# Patient Record
Sex: Female | Born: 1945
Health system: Southern US, Community
[De-identification: ages and names within clinical notes are randomized; demographics above are authoritative.]

## PROBLEM LIST (undated history)

## (undated) DIAGNOSIS — M810 Age-related osteoporosis without current pathological fracture: Secondary | ICD-10-CM

## (undated) DIAGNOSIS — I480 Paroxysmal atrial fibrillation: Secondary | ICD-10-CM

## (undated) DIAGNOSIS — R109 Unspecified abdominal pain: Secondary | ICD-10-CM

## (undated) DIAGNOSIS — E785 Hyperlipidemia, unspecified: Secondary | ICD-10-CM

## (undated) DIAGNOSIS — K219 Gastro-esophageal reflux disease without esophagitis: Secondary | ICD-10-CM

## (undated) DIAGNOSIS — N189 Chronic kidney disease, unspecified: Secondary | ICD-10-CM

## (undated) DIAGNOSIS — Z8719 Personal history of other diseases of the digestive system: Secondary | ICD-10-CM

## (undated) DIAGNOSIS — T7840XA Allergy, unspecified, initial encounter: Secondary | ICD-10-CM

## (undated) DIAGNOSIS — I10 Essential (primary) hypertension: Secondary | ICD-10-CM

## (undated) DIAGNOSIS — N029 Recurrent and persistent hematuria with unspecified morphologic changes: Secondary | ICD-10-CM

## (undated) DIAGNOSIS — I728 Aneurysm of other specified arteries: Secondary | ICD-10-CM

## (undated) HISTORY — DX: Hyperlipidemia, unspecified: E78.5

## (undated) HISTORY — DX: Paroxysmal atrial fibrillation: I48.0

## (undated) HISTORY — PX: ABDOMINAL HYSTERECTOMY: SHX81

## (undated) HISTORY — PX: BILATERAL OOPHORECTOMY: SHX1221

## (undated) HISTORY — DX: Allergy, unspecified, initial encounter: T78.40XA

## (undated) HISTORY — DX: Personal history of other diseases of the digestive system: Z87.19

## (undated) HISTORY — PX: CATARACT EXTRACTION, BILATERAL: SHX1313

## (undated) HISTORY — DX: Chronic kidney disease, unspecified: N18.9

## (undated) HISTORY — DX: Age-related osteoporosis without current pathological fracture: M81.0

## (undated) HISTORY — PX: TUMOR EXCISION: SHX421

## (undated) HISTORY — PX: COLONOSCOPY: SHX174

## (undated) HISTORY — DX: Recurrent and persistent hematuria with unspecified morphologic changes: N02.9

## (undated) HISTORY — DX: Unspecified abdominal pain: R10.9

## (undated) HISTORY — DX: Gastro-esophageal reflux disease without esophagitis: K21.9

## (undated) HISTORY — DX: Aneurysm of other specified arteries: I72.8

## (undated) HISTORY — PX: BREAST BIOPSY: SHX20

## (undated) HISTORY — DX: Essential (primary) hypertension: I10

---

## 2001-04-15 ENCOUNTER — Encounter: Payer: Self-pay | Admitting: Family Medicine

## 2001-04-15 ENCOUNTER — Ambulatory Visit (HOSPITAL_COMMUNITY): Admission: RE | Admit: 2001-04-15 | Discharge: 2001-04-15 | Payer: Self-pay | Admitting: Family Medicine

## 2001-07-22 ENCOUNTER — Other Ambulatory Visit: Admission: RE | Admit: 2001-07-22 | Discharge: 2001-07-22 | Payer: Self-pay | Admitting: Family Medicine

## 2001-11-03 ENCOUNTER — Other Ambulatory Visit: Admission: RE | Admit: 2001-11-03 | Discharge: 2001-11-03 | Payer: Self-pay | Admitting: Obstetrics & Gynecology

## 2004-10-06 ENCOUNTER — Other Ambulatory Visit: Admission: RE | Admit: 2004-10-06 | Discharge: 2004-10-06 | Payer: Self-pay | Admitting: Family Medicine

## 2005-01-26 ENCOUNTER — Encounter (INDEPENDENT_AMBULATORY_CARE_PROVIDER_SITE_OTHER): Payer: Self-pay | Admitting: *Deleted

## 2005-01-26 ENCOUNTER — Ambulatory Visit (HOSPITAL_COMMUNITY): Admission: RE | Admit: 2005-01-26 | Discharge: 2005-01-26 | Payer: Self-pay | Admitting: Gastroenterology

## 2005-06-03 ENCOUNTER — Emergency Department (HOSPITAL_COMMUNITY): Admission: EM | Admit: 2005-06-03 | Discharge: 2005-06-03 | Payer: Self-pay | Admitting: Emergency Medicine

## 2005-07-17 HISTORY — PX: KNEE SURGERY: SHX244

## 2007-07-10 ENCOUNTER — Other Ambulatory Visit: Admission: RE | Admit: 2007-07-10 | Discharge: 2007-07-10 | Payer: Self-pay | Admitting: Family Medicine

## 2008-12-22 ENCOUNTER — Ambulatory Visit (HOSPITAL_COMMUNITY): Admission: RE | Admit: 2008-12-22 | Discharge: 2008-12-22 | Payer: Self-pay | Admitting: Family Medicine

## 2010-12-17 HISTORY — PX: FOOT SURGERY: SHX648

## 2011-10-25 ENCOUNTER — Other Ambulatory Visit: Payer: Self-pay | Admitting: Family Medicine

## 2011-10-25 DIAGNOSIS — M542 Cervicalgia: Secondary | ICD-10-CM

## 2011-10-26 ENCOUNTER — Ambulatory Visit
Admission: RE | Admit: 2011-10-26 | Discharge: 2011-10-26 | Disposition: A | Discharge status: Home / Self Care | Payer: PRIVATE HEALTH INSURANCE | Source: Ambulatory Visit | Attending: Family Medicine | Admitting: Family Medicine

## 2011-10-26 DIAGNOSIS — M542 Cervicalgia: Secondary | ICD-10-CM

## 2011-12-25 ENCOUNTER — Other Ambulatory Visit: Payer: Self-pay | Admitting: Orthopedic Surgery

## 2011-12-25 DIAGNOSIS — M25512 Pain in left shoulder: Secondary | ICD-10-CM

## 2011-12-31 ENCOUNTER — Inpatient Hospital Stay: Admission: RE | Admit: 2011-12-31 | Payer: PRIVATE HEALTH INSURANCE | Source: Ambulatory Visit

## 2012-01-05 ENCOUNTER — Ambulatory Visit
Admission: RE | Admit: 2012-01-05 | Discharge: 2012-01-05 | Disposition: A | Discharge status: Home / Self Care | Payer: Medicare Other | Source: Ambulatory Visit | Attending: Orthopedic Surgery | Admitting: Orthopedic Surgery

## 2012-01-05 DIAGNOSIS — M25512 Pain in left shoulder: Secondary | ICD-10-CM

## 2013-09-08 ENCOUNTER — Other Ambulatory Visit: Payer: Self-pay | Admitting: Gastroenterology

## 2013-09-08 DIAGNOSIS — R109 Unspecified abdominal pain: Secondary | ICD-10-CM

## 2013-09-10 ENCOUNTER — Ambulatory Visit
Admission: RE | Admit: 2013-09-10 | Discharge: 2013-09-10 | Disposition: A | Discharge status: Home / Self Care | Payer: Medicare Other | Source: Ambulatory Visit | Attending: Gastroenterology | Admitting: Gastroenterology

## 2013-09-10 DIAGNOSIS — R109 Unspecified abdominal pain: Secondary | ICD-10-CM

## 2013-09-10 MED ORDER — IOHEXOL 300 MG/ML  SOLN
100.0000 mL | Freq: Once | INTRAMUSCULAR | Status: AC | PRN
  Administered 2013-09-10: 100 mL via INTRAVENOUS

## 2013-09-18 ENCOUNTER — Other Ambulatory Visit (HOSPITAL_COMMUNITY): Payer: Self-pay | Admitting: Family Medicine

## 2013-09-18 DIAGNOSIS — R9389 Abnormal findings on diagnostic imaging of other specified body structures: Secondary | ICD-10-CM

## 2013-09-18 DIAGNOSIS — M899 Disorder of bone, unspecified: Secondary | ICD-10-CM

## 2013-09-25 ENCOUNTER — Encounter (HOSPITAL_COMMUNITY): Payer: Medicare Other

## 2013-10-02 ENCOUNTER — Encounter (HOSPITAL_COMMUNITY)
Admission: RE | Admit: 2013-10-02 | Discharge: 2013-10-02 | Disposition: A | Discharge status: Home / Self Care | Payer: Medicare Other | Source: Ambulatory Visit | Attending: Family Medicine | Admitting: Family Medicine

## 2013-10-02 DIAGNOSIS — M899 Disorder of bone, unspecified: Secondary | ICD-10-CM | POA: Insufficient documentation

## 2013-10-02 DIAGNOSIS — R9389 Abnormal findings on diagnostic imaging of other specified body structures: Secondary | ICD-10-CM

## 2013-10-02 MED ORDER — TECHNETIUM TC 99M MEDRONATE IV KIT
26.1000 | PACK | Freq: Once | INTRAVENOUS | Status: AC | PRN
  Administered 2013-10-02: 26.1 via INTRAVENOUS

## 2013-10-17 DIAGNOSIS — I728 Aneurysm of other specified arteries: Secondary | ICD-10-CM

## 2013-10-17 DIAGNOSIS — R109 Unspecified abdominal pain: Secondary | ICD-10-CM

## 2013-10-17 HISTORY — DX: Aneurysm of other specified arteries: I72.8

## 2013-10-17 HISTORY — DX: Unspecified abdominal pain: R10.9

## 2013-10-29 ENCOUNTER — Other Ambulatory Visit: Payer: Self-pay | Admitting: Gastroenterology

## 2013-10-29 DIAGNOSIS — R109 Unspecified abdominal pain: Secondary | ICD-10-CM

## 2013-11-02 ENCOUNTER — Ambulatory Visit
Admission: RE | Admit: 2013-11-02 | Discharge: 2013-11-02 | Disposition: A | Discharge status: Home / Self Care | Payer: Medicare Other | Source: Ambulatory Visit | Attending: Gastroenterology | Admitting: Gastroenterology

## 2013-11-02 DIAGNOSIS — R109 Unspecified abdominal pain: Secondary | ICD-10-CM

## 2013-11-03 ENCOUNTER — Encounter: Payer: Self-pay | Admitting: Vascular Surgery

## 2013-11-30 ENCOUNTER — Encounter: Payer: Self-pay | Admitting: Vascular Surgery

## 2013-12-01 ENCOUNTER — Ambulatory Visit (INDEPENDENT_AMBULATORY_CARE_PROVIDER_SITE_OTHER): Payer: Medicare Other | Admitting: Vascular Surgery

## 2013-12-01 ENCOUNTER — Encounter (INDEPENDENT_AMBULATORY_CARE_PROVIDER_SITE_OTHER): Payer: Self-pay

## 2013-12-01 ENCOUNTER — Encounter: Payer: Self-pay | Admitting: Vascular Surgery

## 2013-12-01 VITALS — Ht 64.25 in | Wt 155.0 lb

## 2013-12-01 DIAGNOSIS — I728 Aneurysm of other specified arteries: Secondary | ICD-10-CM

## 2013-12-01 NOTE — Progress Notes (Signed)
Patient name: Mary Riley MRN: [id number] DOB: July 31, 1946 Sex: female   Referred by: Dulce Sellar  Reason for referral:  Chief Complaint  Patient presents with  . New Evaluation    small splenic artery aneurysm     HISTORY OF PRESENT ILLNESS: The patient presents today for discussion of incidental finding of splenic artery aneurysm. She underwent CT scan for evaluation of diffuse abdominal discomfort. No etiology was found. She was had an incidental finding of a 1.3 cm splenic artery aneurysm is here today for discussion of this. She is quite concerned regarding this. Does have a family history of intracranial aneurysm with complication. He has no other history of the aneurysm. Does have a history of Crohn's disease and gastroesophageal reflux. Her abdominal cyst of symptoms her lower abdominal and are associated with bloating as well.  Past Medical History  Diagnosis Date  . Aneurysm of splenic artery Nov 2014    Ref by Dr. Willis Modena  . Allergy   . Hypertension   . GERD (gastroesophageal reflux disease)   . History of Crohn's disease   . Benign hematuria   . Abdominal pain Nov. 2014    Past Surgical History  Procedure Laterality Date  . Knee surgery Right Aug. 2006  . Foot surgery Left 2012  . Colonoscopy  2006 and 2011    Dr. Sherin Quarry    History   Social History  . Marital Status: Married    Spouse Name: N/A    Number of Children: N/A  . Years of Education: N/A   Occupational History  . Not on file.   Social History Main Topics  . Smoking status: Never Smoker   . Smokeless tobacco: Never Used  . Alcohol Use: 0.6 oz/week    1 Glasses of wine per week  . Drug Use: No  . Sexual Activity: Not on file   Other Topics Concern  . Not on file   Social History Narrative  . No narrative on file    Family History  Problem Relation Age of Onset  . Heart disease Mother 56    Heart Disease before age 8- Brain Aneurysm  . Hypertension Mother   . Aneurysm  Mother     brain  . Heart disease Sister     Brain Aneurysm  . Aneurysm Sister     brain   . Cancer Maternal Grandmother     Colon  . Cancer Brother     Allergies as of 12/01/2013 - Review Complete 12/01/2013  Allergen Reaction Noted  . Dilaudid [hydromorphone hcl] Nausea And Vomiting 10/02/2013  . Ace inhibitors Cough 10/02/2013  . Codeine Nausea Only 10/02/2013  . Crestor [rosuvastatin] Other (See Comments) 10/02/2013  . Demerol [meperidine] Nausea Only 10/02/2013  . Lipitor [atorvastatin] Other (See Comments) 10/02/2013  . Zithromax [azithromycin]  10/02/2013  . Biaxin [clarithromycin] Rash 10/02/2013  . Sulfa antibiotics Rash 10/02/2013    Current Outpatient Prescriptions on File Prior to Visit  Medication Sig Dispense Refill  . Cetirizine-Pseudoephedrine (ZYRTEC-D PO) Take 5-120 mg by mouth as needed.       [name] omeprazole (PRILOSEC) 40 MG capsule Take 40 mg by mouth daily.       No current facility-administered medications on file prior to visit.     REVIEW OF SYSTEMS:  Positives indicated with an "X"  CARDIOVASCULAR:  [ ]  chest pain   [ ]  chest pressure   [ ]  palpitations   [ ]  orthopnea   [ ]  dyspnea  on exertion   [ ]  claudication   [ ]  rest pain   [ ]  DVT   [ ]  phlebitis PULMONARY:   [ ]  productive cough   [ ]  asthma   [ ]  wheezing NEUROLOGIC:   [ ]  weakness  [ ]  paresthesias  [ ]  aphasia  [ ]  amaurosis  [ ]  dizziness HEMATOLOGIC:   [ ]  bleeding problems   [ ]  clotting disorders MUSCULOSKELETAL:  [ ]  joint pain   [ ]  joint swelling GASTROINTESTINAL: [ ]   blood in stool  [ ]   hematemesis GENITOURINARY:  [ ]   dysuria  [ ]   hematuria PSYCHIATRIC:  [ ]  history of major depression INTEGUMENTARY:  [ ]  rashes  [ ]  ulcers CONSTITUTIONAL:  [ ]  fever   [ ]  chills  PHYSICAL EXAMINATION:  General: The patient is a well-nourished female, in no acute distress. Vital signs are BP   Ht 5' 4.25" (1.632 m)  Wt 155 lb (70.308 kg)  BMI 26.40 kg/m2 Pulmonary: There is a good  air exchange bilaterally without wheezing or rales. Abdomen: Soft and non-tender with normal pitch bowel sounds. No masses noted no abdominal bruit Musculoskeletal: There are no major deformities.  There is no significant extremity pain. Neurologic: No focal weakness or paresthesias are detected, Skin: There are no ulcer or rashes noted. Psychiatric: The patient has normal affect. Cardiovascular: There is a regular rate and rhythm without significant murmur appreciated. 2+ radial and femoral pulses  CT scan was reviewed. This reveals a partially calcified 1.3 cm splenic artery aneurysm which is in the midportion of the spleen.  Impression and Plan:  I had a long discussion with the patient. I explained the natural history of splenic artery aneurysms. I explained that all like that this is been present for many years and was an incidental finding with her recent CT scan. I explained the natural history which I usually is very stable size. I did explain that this could potentially grow and could potentially be at risk for a period I explained symptoms of leaking splenic artery aneurysm to include upper abdominal and flank pain. Explain that the likelihood with a 1.3 cm is extremely low. I did explain the need for continued observation of this. I have recommended that we repeat her CT scan in one year to rule out any progression in size. It did remain stable we would potentially drop back to every other year CT scan. She understands to report immediately to the emergency room should she have any new onset of severe pain. Otherwise we will see her in one year for continued followup    Cletus Mehlhoff [facility] Office: [phone]

## 2013-12-01 NOTE — Progress Notes (Signed)
Vitals were not taken at this visit due to patient leaving before nurse exam was complete. Chanda Maness-Harrison CMA, AAMA

## 2013-12-02 ENCOUNTER — Encounter: Payer: Self-pay | Admitting: *Deleted

## 2013-12-02 NOTE — Addendum Note (Signed)
Addended by: Sharee Pimple on: 12/02/2013 10:33 AM   Modules accepted: Orders

## 2014-12-07 ENCOUNTER — Ambulatory Visit: Payer: Medicare Other | Admitting: Vascular Surgery

## 2014-12-07 ENCOUNTER — Other Ambulatory Visit: Payer: Medicare Other

## 2014-12-27 ENCOUNTER — Encounter: Payer: Self-pay | Admitting: Vascular Surgery

## 2014-12-27 ENCOUNTER — Other Ambulatory Visit: Payer: Self-pay | Admitting: *Deleted

## 2014-12-27 DIAGNOSIS — Z01812 Encounter for preprocedural laboratory examination: Secondary | ICD-10-CM

## 2014-12-28 ENCOUNTER — Inpatient Hospital Stay: Admission: RE | Admit: 2014-12-28 | Payer: Medicare Other | Source: Ambulatory Visit

## 2014-12-28 ENCOUNTER — Ambulatory Visit: Payer: Medicare Other | Admitting: Vascular Surgery

## 2016-01-18 ENCOUNTER — Other Ambulatory Visit: Payer: Self-pay | Admitting: Family Medicine

## 2016-01-18 DIAGNOSIS — I728 Aneurysm of other specified arteries: Secondary | ICD-10-CM

## 2016-01-25 ENCOUNTER — Ambulatory Visit
Admission: RE | Admit: 2016-01-25 | Discharge: 2016-01-25 | Disposition: A | Discharge status: Home / Self Care | Payer: Medicare Other | Source: Ambulatory Visit | Attending: Family Medicine | Admitting: Family Medicine

## 2016-01-25 DIAGNOSIS — I728 Aneurysm of other specified arteries: Secondary | ICD-10-CM

## 2016-01-25 MED ORDER — IOPAMIDOL (ISOVUE-370) INJECTION 76%
75.0000 mL | Freq: Once | INTRAVENOUS | Status: AC | PRN
  Administered 2016-01-25: 75 mL via INTRAVENOUS

## 2016-02-02 ENCOUNTER — Encounter (HOSPITAL_COMMUNITY): Payer: Self-pay | Admitting: Emergency Medicine

## 2016-02-02 ENCOUNTER — Emergency Department (HOSPITAL_COMMUNITY): Payer: Medicare Other

## 2016-02-02 ENCOUNTER — Observation Stay (HOSPITAL_COMMUNITY)
Admission: EM | Admit: 2016-02-02 | Discharge: 2016-02-03 | Disposition: A | Discharge status: Home / Self Care | Payer: Medicare Other | Attending: Cardiovascular Disease | Admitting: Cardiovascular Disease

## 2016-02-02 DIAGNOSIS — N029 Recurrent and persistent hematuria with unspecified morphologic changes: Secondary | ICD-10-CM | POA: Diagnosis not present

## 2016-02-02 DIAGNOSIS — Z79899 Other long term (current) drug therapy: Secondary | ICD-10-CM | POA: Insufficient documentation

## 2016-02-02 DIAGNOSIS — I4891 Unspecified atrial fibrillation: Secondary | ICD-10-CM | POA: Diagnosis present

## 2016-02-02 DIAGNOSIS — K219 Gastro-esophageal reflux disease without esophagitis: Secondary | ICD-10-CM | POA: Diagnosis not present

## 2016-02-02 DIAGNOSIS — I10 Essential (primary) hypertension: Secondary | ICD-10-CM | POA: Insufficient documentation

## 2016-02-02 DIAGNOSIS — I728 Aneurysm of other specified arteries: Secondary | ICD-10-CM | POA: Insufficient documentation

## 2016-02-02 LAB — CBC
HCT: 42.8 % (ref 36.0–46.0)
HEMOGLOBIN: 14 g/dL (ref 12.0–15.0)
MCH: 30.7 pg (ref 26.0–34.0)
MCHC: 32.7 g/dL (ref 30.0–36.0)
MCV: 93.9 fL (ref 78.0–100.0)
Platelets: 286 10*3/uL (ref 150–400)
RBC: 4.56 MIL/uL (ref 3.87–5.11)
RDW: 12.7 % (ref 11.5–15.5)
WBC: 9.3 10*3/uL (ref 4.0–10.5)

## 2016-02-02 LAB — BASIC METABOLIC PANEL
ANION GAP: 14 (ref 5–15)
BUN: 18 mg/dL (ref 6–20)
CALCIUM: 9.7 mg/dL (ref 8.9–10.3)
CO2: 21 mmol/L — AB (ref 22–32)
Chloride: 107 mmol/L (ref 101–111)
Creatinine, Ser: 1.1 mg/dL — ABNORMAL HIGH (ref 0.44–1.00)
GFR calc Af Amer: 58 mL/min — ABNORMAL LOW (ref >60)
GFR calc non Af Amer: 50 mL/min — ABNORMAL LOW (ref >60)
GLUCOSE: 98 mg/dL (ref 65–99)
Potassium: 3.6 mmol/L (ref 3.5–5.1)
Sodium: 142 mmol/L (ref 135–145)

## 2016-02-02 LAB — T4, FREE: Free T4: 0.86 ng/dL (ref 0.61–1.12)

## 2016-02-02 LAB — MAGNESIUM: Magnesium: 1.9 mg/dL (ref 1.7–2.4)

## 2016-02-02 LAB — TSH: TSH: 4.323 u[IU]/mL (ref 0.350–4.500)

## 2016-02-02 MED ORDER — ACETAMINOPHEN 325 MG PO TABS: 650.0000 mg | ORAL_TABLET | ORAL | Status: DC | PRN

## 2016-02-02 MED ORDER — SODIUM CHLORIDE 0.9 % IV BOLUS (SEPSIS)
500.0000 mL | Freq: Once | INTRAVENOUS | Status: AC
  Administered 2016-02-02: 500 mL via INTRAVENOUS

## 2016-02-02 MED ORDER — PRAVASTATIN SODIUM 20 MG PO TABS
20.0000 mg | ORAL_TABLET | Freq: Every day | ORAL | Status: DC
Start: 2016-02-03 — End: 2016-02-03

## 2016-02-02 MED ORDER — METOPROLOL TARTRATE 25 MG PO TABS
50.0000 mg | ORAL_TABLET | Freq: Once | ORAL | Status: AC
  Administered 2016-02-02: 50 mg via ORAL
  Filled 2016-02-02: qty 2

## 2016-02-02 MED ORDER — ONDANSETRON HCL 4 MG/2ML IJ SOLN: 4.0000 mg | Freq: Four times a day (QID) | INTRAMUSCULAR | Status: DC | PRN

## 2016-02-02 MED ORDER — METOPROLOL TARTRATE 50 MG PO TABS
50.0000 mg | ORAL_TABLET | Freq: Two times a day (BID) | ORAL | Status: DC
  Administered 2016-02-02 – 2016-02-03 (×2): 50 mg via ORAL
  Filled 2016-02-02 (×2): qty 1

## 2016-02-02 MED ORDER — AMLODIPINE BESYLATE 5 MG PO TABS
5.0000 mg | ORAL_TABLET | Freq: Every day | ORAL | Status: DC
  Administered 2016-02-02: 5 mg via ORAL
  Filled 2016-02-02: qty 1

## 2016-02-02 MED ORDER — PANTOPRAZOLE SODIUM 40 MG PO TBEC
40.0000 mg | DELAYED_RELEASE_TABLET | Freq: Every day | ORAL | Status: DC
  Administered 2016-02-03: 40 mg via ORAL
  Filled 2016-02-02: qty 1

## 2016-02-02 MED ORDER — NITROGLYCERIN 0.4 MG SL SUBL: 0.4000 mg | SUBLINGUAL_TABLET | SUBLINGUAL | Status: DC | PRN

## 2016-02-02 MED ORDER — VITAMIN D 1000 UNITS PO TABS
1000.0000 [IU] | ORAL_TABLET | Freq: Every day | ORAL | Status: DC
  Administered 2016-02-03: 1000 [IU] via ORAL
  Filled 2016-02-02: qty 1

## 2016-02-02 MED ORDER — HEPARIN SODIUM (PORCINE) 5000 UNIT/ML IJ SOLN
5000.0000 [IU] | Freq: Three times a day (TID) | INTRAMUSCULAR | Status: DC
  Administered 2016-02-02 – 2016-02-03 (×2): 5000 [IU] via SUBCUTANEOUS
  Filled 2016-02-02 (×2): qty 1

## 2016-02-02 MED ORDER — ASPIRIN EC 81 MG PO TBEC
81.0000 mg | DELAYED_RELEASE_TABLET | Freq: Every day | ORAL | Status: DC
  Administered 2016-02-02 – 2016-02-03 (×2): 81 mg via ORAL
  Filled 2016-02-02 (×2): qty 1

## 2016-02-02 NOTE — ED Notes (Signed)
Unable to give report at this time.  Nurse is to call back

## 2016-02-02 NOTE — ED Notes (Signed)
Pt arrives via [facility], ems reports patient was painting when she began feeling like she had indigestion and like her heart was racing, pt was found to be in afib rvr upon ems arrival with rate of 180-200, pt given 6mg  of adenosine given then a second dose of 12mg  of adenosine given. Pt given a total of 20mg  cardizem, pt at a rate of 116, Sinus rhythm upon arrival. Pt denies any cp or sob, a/o x4

## 2016-02-02 NOTE — ED Notes (Signed)
MD at bedside. 

## 2016-02-02 NOTE — ED Provider Notes (Signed)
CSN: [id number]     Arrival date & time 02/02/16  1425 History   First MD Initiated Contact with Patient 02/02/16 1501     Chief Complaint  Patient presents with  . Atrial Fibrillation     (Consider location/radiation/quality/duration/timing/severity/associated sxs/prior Treatment) Patient is a 70 y.o. female presenting with atrial fibrillation. The history is provided by the patient.  Atrial Fibrillation This is a recurrent problem. The current episode started 1 to 4 weeks ago. The problem occurs intermittently. The problem has been resolved. Pertinent negatives include no abdominal pain, anorexia, chest pain, chills, congestion, coughing, fatigue, fever, headaches, nausea, rash, sore throat, vertigo, visual change, vomiting or weakness. Nothing aggravates the symptoms. Treatments tried: diltiazem given by EMS (20mg  total)    Past Medical History  Diagnosis Date  . Aneurysm of splenic artery [facility]) Nov 2014    Ref by Dr. Arta Silence  . Allergy   . Hypertension   . GERD (gastroesophageal reflux disease)   . History of Crohn's disease   . Benign hematuria   . Abdominal pain Nov. 2014   Past Surgical History  Procedure Laterality Date  . Knee surgery Right Aug. 2006  . Foot surgery Left 2012  . Colonoscopy  2006 and 2011    Dr. Sammuel Cooper   Family History  Problem Relation Age of Onset  . Heart disease Mother 15    Heart Disease before age 80- Brain Aneurysm  . Hypertension Mother   . Aneurysm Mother     brain  . Heart disease Sister     Brain Aneurysm  . Aneurysm Sister     brain   . Cancer Maternal Grandmother     Colon  . Cancer Brother    Social History  Substance Use Topics  . Smoking status: Never Smoker   . Smokeless tobacco: Never Used  . Alcohol Use: 0.6 oz/week    1 Glasses of wine per week   OB History    No data available     Review of Systems  Constitutional: Negative for fever, chills, appetite change and fatigue.  HENT: Negative for  congestion, ear pain, facial swelling, mouth sores and sore throat.   Eyes: Negative for visual disturbance.  Respiratory: Negative for cough, chest tightness and shortness of breath.   Cardiovascular: Negative for chest pain and palpitations.  Gastrointestinal: Negative for nausea, vomiting, abdominal pain, diarrhea, blood in stool and anorexia.  Endocrine: Negative for cold intolerance and heat intolerance.  Genitourinary: Negative for frequency, decreased urine volume and difficulty urinating.  Musculoskeletal: Negative for back pain and neck stiffness.  Skin: Negative for rash.  Neurological: Negative for dizziness, vertigo, weakness, light-headedness and headaches.  All other systems reviewed and are negative.     Allergies  Dilaudid; Ace inhibitors; Codeine; Crestor; Demerol; Lipitor; Zithromax; Biaxin; and Sulfa antibiotics  Home Medications   Prior to Admission medications   Medication Sig Start Date End Date Taking? Authorizing Provider  amLODipine (NORVASC) 5 MG tablet Take 5 mg by mouth at bedtime. 01/26/16  Yes Historical Provider, MD  cholecalciferol (VITAMIN D) 1000 units tablet Take 1,000 Units by mouth daily.   Yes Historical Provider, MD  Coenzyme Q10 (COQ10 PO) Take 1 tablet by mouth daily.   Yes Historical Provider, MD  LIVALO 1 MG TABS Take 1 mg by mouth 2 (two) times a week. 01/24/16  Yes Historical Provider, MD  omeprazole (PRILOSEC) 40 MG capsule Take 40 mg by mouth daily.   Yes Historical Provider, MD  BP 161/71 mmHg  Pulse 82  Temp(Src) 98 F (36.7 C) (Oral)  Resp 19  Ht 5\' 3"  (1.6 m)  Wt 69.355 kg  BMI 27.09 kg/m2  SpO2 100% Physical Exam  Constitutional: She is oriented to person, place, and time. She appears well-developed and well-nourished. No distress.  HENT:  Head: Normocephalic and atraumatic.  Right Ear: External ear normal.  Left Ear: External ear normal.  Nose: Nose normal.  Eyes: Conjunctivae and EOM are normal. Pupils are equal, round,  and reactive to light. Right eye exhibits no discharge. Left eye exhibits no discharge. No scleral icterus.  Neck: Normal range of motion. Neck supple.  Cardiovascular: Regular rhythm and normal heart sounds.  Tachycardia present.  Exam reveals no gallop and no friction rub.   No murmur heard. Pulmonary/Chest: Effort normal and breath sounds normal. No stridor. No respiratory distress. She has no wheezes.  Abdominal: Soft. She exhibits no distension. There is no tenderness.  Musculoskeletal: She exhibits no edema or tenderness.  Neurological: She is alert and oriented to person, place, and time.  Skin: Skin is warm and dry. No rash noted. She is not diaphoretic. No erythema.  Psychiatric: She has a normal mood and affect.    ED Course  Procedures (including critical care time) Labs Review Labs Reviewed  BASIC METABOLIC PANEL - Abnormal; Notable for the following:    CO2 21 (*)    Creatinine, Ser 1.10 (*)    GFR calc non Af Amer 50 (*)    GFR calc Af Amer 58 (*)    All other components within normal limits  CBC  TSH  T4, FREE  MAGNESIUM  BASIC METABOLIC PANEL  LIPID PANEL    Imaging Review Dg Chest 2 View  02/02/2016  CLINICAL DATA:  Tachycardia and hypertension EXAM: CHEST  2 VIEW COMPARISON:  None. FINDINGS: The heart size and mediastinal contours are within normal limits. Both lungs are clear. The visualized skeletal structures are unremarkable. IMPRESSION: No active cardiopulmonary disease. Electronically Signed   By: Inez Catalina M.D.   On: 02/02/2016 15:10   I have personally reviewed and evaluated these images and lab results as part of my medical decision-making.   EKG Interpretation   Date/Time:  Thursday February 02 2016 17:01:30 EST Ventricular Rate:  94 PR Interval:  144 QRS Duration: 92 QT Interval:  362 QTC Calculation: 453 R Axis:   65 Text Interpretation:  Sinus rhythm Abnormal R-wave progression, early  transition Borderline T abnormalities, anterior  leads Improved tachycardia  compared to last EKG Confirmed by LIU MD, DANA [phone]) on 02/02/2016  5:06:51 PM      MDM   70 year old female is evidence with new onset A. fib RVR noted by EMS with a rate of 180s to 200s. Caryl Pina thought to be SVT but was not responsive to adenosine. A. fib was converted with 20 mg of diltiazem. Rest of the history as above.  On arrival patient was tachycardic and sinus rhythm. Patient was provided with IV fluids with improvement of rate to below 100s. She maintained normal sinus rhythm en route. EKGs. Labs did not reveal any evidence to suggest an trigger. Her history is not suggestive of pulmonary embolism.  Patient has a CHAD-VASC of 3. Cardiology consultation who assessed the patient in the emergency department and will admit for further management.  Patient was seen in conjunction with Dr. Oleta Mouse.  Final diagnoses:  Atrial fibrillation with RVR ([place])        Addison Lank,  MD 02/03/16 0130  Forde Dandy, MD 02/03/16 [id number]

## 2016-02-02 NOTE — ED Notes (Signed)
Patient transported to X-ray 

## 2016-02-02 NOTE — H&P (Signed)
Patient ID: Joddie Digiorgio MRN: [id number], DOB/AGE: 06-03-1946   Admit date: 02/02/2016   Primary Physician: Vidal Schwalbe, MD Primary Cardiologist: Dr.Nishan  Pt. Profile:  Mary Riley is a 70 y.o. female with a history of HTN and aneurysm of splenic artery who brought by EMS to [facility] ED for evaluation of new onset afib.   HPI: As above. The patient has hx of HTN however was off antihypertensive for 9 years until last week when her blood pressure was up and her primary care provider started at rest. Today she had a episode of palpitation lasting for 2 hours with dizziness and called EMS who found to have tumor in A. fib with RVR. Started on IV Cardizem and she converted to normal sinus rhythm. This is her third episode in past one week. Initially it was lasted for 15 minutes then for one hour and today it lasted for 2-3 hours. The patient denies any chest pain or shortness of breath. Patient admits to having intermittent ankle edema. The patient denies orthopnea, PND, syncope, nausea, vomiting, diarrhea or blood in her stool or urine. No recent illness. Never smoke tobacco or used illicit drugs.  In ED EKG shows normal sinus sinus rhythm with tachycardia and prolonged QT. Review of EMS strips showed A. fib with RVR. Serum creatinine of 1.10. Free T4 normal.  Problem List  Past Medical History  Diagnosis Date  . Aneurysm of splenic artery [facility]) Nov 2014    Ref by Dr. Arta Silence  . Allergy   . Hypertension   . GERD (gastroesophageal reflux disease)   . History of Crohn's disease   . Benign hematuria   . Abdominal pain Nov. 2014    Past Surgical History  Procedure Laterality Date  . Knee surgery Right Aug. 2006  . Foot surgery Left 2012  . Colonoscopy  2006 and 2011    Dr. Sammuel Cooper     Allergies  Allergies  Allergen Reactions  . Dilaudid [Hydromorphone Hcl] Nausea And Vomiting  . Ace Inhibitors Cough  . Codeine Nausea Only  . Crestor [Rosuvastatin] Other (See  Comments)    Joints hurt  . Demerol [Meperidine] Nausea Only  . Lipitor [Atorvastatin] Other (See Comments)    Joints hurt  . Zithromax [Azithromycin]   . Biaxin [Clarithromycin] Rash  . Sulfa Antibiotics Rash     Home Medications  Prior to Admission medications   Medication Sig Start Date End Date Taking? Authorizing Provider  amLODipine (NORVASC) 5 MG tablet Take 5 mg by mouth at bedtime. 01/26/16  Yes Historical Provider, MD  cholecalciferol (VITAMIN D) 1000 units tablet Take 1,000 Units by mouth daily.   Yes Historical Provider, MD  Coenzyme Q10 (COQ10 PO) Take 1 tablet by mouth daily.   Yes Historical Provider, MD  LIVALO 1 MG TABS Take 1 mg by mouth 2 (two) times a week. 01/24/16  Yes Historical Provider, MD  omeprazole (PRILOSEC) 40 MG capsule Take 40 mg by mouth daily.   Yes Historical Provider, MD    Family History  Family History  Problem Relation Age of Onset  . Heart disease Mother 69    Heart Disease before age 50- Brain Aneurysm  . Hypertension Mother   . Aneurysm Mother     brain  . Heart disease Sister     Brain Aneurysm  . Aneurysm Sister     brain   . Cancer Maternal Grandmother     Colon  . Cancer Brother    Family  Status  Relation Status Death Age  . Mother Deceased 43    Brain Aneurysm  . Sister Deceased   . Maternal Grandmother Deceased   . Father Deceased      Social History  Social History   Social History  . Marital Status: Married    Spouse Name: N/A  . Number of Children: N/A  . Years of Education: N/A   Occupational History  . Not on file.   Social History Main Topics  . Smoking status: Never Smoker   . Smokeless tobacco: Never Used  . Alcohol Use: 0.6 oz/week    1 Glasses of wine per week  . Drug Use: No  . Sexual Activity: Not on file   Other Topics Concern  . Not on file   Social History Narrative     All other systems reviewed and are otherwise negative except as noted above.  Physical Exam  Blood pressure  127/94, pulse 99, temperature 97.8 F (36.6 C), temperature source Oral, resp. rate 12, SpO2 97 %.  General: Pleasant, NAD Psych: Normal affect. Neuro: Alert and oriented X 3. Moves all extremities spontaneously. HEENT: Normal  Neck: Supple without bruits or JVD. Lungs:  Resp regular and unlabored, CTA. Heart: RR with tachycardia no s3, s4, or murmurs. Abdomen: Soft, non-tender, non-distended, BS + x 4.  Extremities: No clubbing, cyanosis. Trace left ankle edema. DP/PT/Radials 2+ and equal bilaterally.  Labs  No results for input(s): CKTOTAL, CKMB, TROPONINI in the last 72 hours. Lab Results  Component Value Date   WBC 9.3 02/02/2016   HGB 14.0 02/02/2016   HCT 42.8 02/02/2016   MCV 93.9 02/02/2016   PLT 286 02/02/2016    Recent Labs Lab 02/02/16 1445  NA 142  K 3.6  CL 107  CO2 21*  BUN 18  CREATININE 1.10*  CALCIUM 9.7  GLUCOSE 98   No results found for: CHOL, HDL, LDLCALC, TRIG No results found for: DDIMER   Radiology/Studies  Dg Chest 2 View  02/02/2016  CLINICAL DATA:  Tachycardia and hypertension EXAM: CHEST  2 VIEW COMPARISON:  None. FINDINGS: The heart size and mediastinal contours are within normal limits. Both lungs are clear. The visualized skeletal structures are unremarkable. IMPRESSION: No active cardiopulmonary disease. Electronically Signed   By: Inez Catalina M.D.   On: 02/02/2016 15:10   Ct Angio Abdomen W/cm &/or Wo Contrast  01/25/2016  CLINICAL DATA:  Followup splenic artery aneurysm EXAM: CT ANGIOGRAPHY ABDOMEN TECHNIQUE: Multidetector CT imaging of the abdomen was performed using the standard protocol during bolus administration of intravenous contrast. Multiplanar reconstructed images including MIPs were obtained and reviewed to evaluate the vascular anatomy. CONTRAST:  75 mL Isovue 370 COMPARISON:  09/10/2013 FINDINGS: The lung bases are free of acute infiltrate or sizable effusion. The liver, gallbladder, spleen, adrenal glands and pancreas are  within normal limits. The kidneys are well visualized bilaterally and demonstrate some cystic change particularly on the right. No obstructive changes or renal calculi are seen. The abdominal aorta demonstrates patency of the inferior mesenteric artery, superior mesenteric artery and celiac axis. Dual renal arteries are noted on the right with single renal artery on the left. Mild atherosclerotic calcification is noted in the proximal left renal artery. There is evidence of a predominately thrombosed splenic artery aneurysm. It measures 13-14 mm and is stable in appearance from the prior exam. No significant flow within the aneurysm is identified. There are however additional areas of splenic artery aneurysms which are not thrombosed. In  the midportion of the splenic artery there is a 10 mm aneurysm which demonstrates flow within. This is best visualized on image number 48 of series 5 and image 90 of series 602. More distal in the splenic artery there is a 6 mm aneurysm noted. This is best seen on image number 45 of series 5. No evidence of splenic infarct is seen. Review of the MIP images confirms the above findings. IMPRESSION: Nearly completely thrombosed splenic artery aneurysm which measures 13-14 mm and is stable in appearance from the prior exam. Additionally in the mid and distal portion of the splenic artery branches all there are additional patent splenic artery aneurysms as described above. The largest of these measures 10 mm in dimension and in retrospect were likely present on the prior exam and appear stable. No other focal abnormality is noted. Electronically Signed   By: Inez Catalina M.D.   On: 01/25/2016 12:27    ECG  Vent. rate 108 BPM PR interval 152 ms QRS duration 78 ms QT/QTc 345/462 ms P-R-T axes 77 57 -22  ASSESSMENT AND PLAN  1. Afib  with RVR - This is her third episode in past one week. Each time lasting for longer hour. Upon EMS arrival patient found to have A. fib RVR  however given 6mg  of adenosine given then a second dose of 12mg  of adenosine given. Then converted to normal sinus rhythm on 20mg  of IV cardizem during ED enrout. Review of telemetry strip shows A. fib with RVR. Currently in sinus tachycardia. - Will start her on a metoprolol 50 mg twice a day. Get echocardiogram. Her CHADSVAScs score is 3. He does not want to start anticoagulation. Will start on aspirin 81 mg for now. He understands the risk of not being anticoagulated.  - Free T4 normal.  2. Hypertension - Relatively uncontrolled. - Continue home dose of Norvasc. Add beta blocker.  3. GERD - Continue PPI.   4. HL - Had lab work 2 weeks ago AT pcp office. Does not remember numbers. Continue home dose of statin.   Jarrett Soho, PA-C 02/02/2016, 5:23 PM Pager [phone]  Patient examined chart reviewed  Fairly healthy female.  Has not been on BP meds for 9 years last few weeks has had spikes in BP and HR.  3 episodes of rapid palpitations with EMS noting PAF rate around 160.  Started on low dose amlodipine by primary Harlan Stains a week ago. No recent TSH.  No excess ETOH, stimulants.  Will admit for telemetry, check echo and start beta blocker Will need to make decision about anticoagulation Patient would like to avoid but willing to consider NOAC.  Currently stable in NSR rate 95  This patients CHA2DS2-VASc Score and unadjusted Ischemic Stroke Rate (% per year) is equal to 3.2 % stroke rate/year from a score of 3  Above score calculated as 1 point each if present [CHF, HTN, DM, Vascular=MI/PAD/Aortic Plaque, Age if 65-74, or Female] Above score calculated as 2 points each if present [Age > 75, or Stroke/TIA/TE]  Jenkins Rouge

## 2016-02-03 ENCOUNTER — Telehealth: Payer: Self-pay | Admitting: Physician Assistant

## 2016-02-03 ENCOUNTER — Observation Stay (HOSPITAL_BASED_OUTPATIENT_CLINIC_OR_DEPARTMENT_OTHER): Payer: Medicare Other

## 2016-02-03 DIAGNOSIS — I4891 Unspecified atrial fibrillation: Secondary | ICD-10-CM

## 2016-02-03 LAB — BASIC METABOLIC PANEL
ANION GAP: 12 (ref 5–15)
BUN: 15 mg/dL (ref 6–20)
CHLORIDE: 108 mmol/L (ref 101–111)
CO2: 24 mmol/L (ref 22–32)
Calcium: 9.7 mg/dL (ref 8.9–10.3)
Creatinine, Ser: 0.86 mg/dL (ref 0.44–1.00)
GFR calc Af Amer: >60 mL/min (ref >60)
GFR calc non Af Amer: >60 mL/min (ref >60)
Glucose, Bld: 90 mg/dL (ref 65–99)
POTASSIUM: 3.9 mmol/L (ref 3.5–5.1)
SODIUM: 144 mmol/L (ref 135–145)

## 2016-02-03 LAB — LIPID PANEL
CHOL/HDL RATIO: 3.6 ratio
CHOLESTEROL: 259 mg/dL — AB (ref 0–200)
HDL: 72 mg/dL (ref >40)
LDL Cholesterol: 162 mg/dL — ABNORMAL HIGH (ref 0–99)
TRIGLYCERIDES: 124 mg/dL (ref <150)
VLDL: 25 mg/dL (ref 0–40)

## 2016-02-03 MED ORDER — AMLODIPINE BESYLATE 10 MG PO TABS: 10.0000 mg | ORAL_TABLET | Freq: Every day | ORAL | Status: DC

## 2016-02-03 MED ORDER — RIVAROXABAN 20 MG PO TABS: 20.0000 mg | ORAL_TABLET | Freq: Every day | ORAL | Status: DC

## 2016-02-03 MED ORDER — METOPROLOL TARTRATE 50 MG PO TABS
50.0000 mg | ORAL_TABLET | Freq: Two times a day (BID) | ORAL | Status: DC
Start: 2016-02-03 — End: 2016-04-18

## 2016-02-03 NOTE — Progress Notes (Signed)
  Echocardiogram 2D Echocardiogram has been performed.  Mary Riley 02/03/2016, 9:18 AM

## 2016-02-03 NOTE — Discharge Summary (Signed)
.    Discharge Summary    Patient ID: Mary Riley,  MRN: [id number], DOB/AGE: 1946-12-10 70 y.o.  Admit date: 02/02/2016 Discharge date: 02/03/2016  Primary Care Provider: WHITE,CYNTHIA S Primary Cardiologist: Dr Johnsie Cancel  Discharge Diagnoses    Primary Problems:   Atrial fibrillation with RVR ([place]) Anticoagulation with Xarelto, started this admit HTN Hx Splenic artery aneurysm, nearly completely thrombosed by CT 01/25/2016  Allergies Allergies  Allergen Reactions  . Dilaudid [Hydromorphone Hcl] Nausea And Vomiting  . Ace Inhibitors Cough  . Codeine Nausea Only  . Crestor [Rosuvastatin] Other (See Comments)    Joints hurt  . Demerol [Meperidine] Nausea Only  . Lipitor [Atorvastatin] Other (See Comments)    Joints hurt  . Zithromax [Azithromycin]   . Biaxin [Clarithromycin] Rash  . Sulfa Antibiotics Rash    Diagnostic Studies/Procedures    Echo - Procedure narrative: Transthoracic echocardiography. Image quality was adequate. The study was technically difficult. - Left ventricle: The cavity size was normal. Systolic function was normal. The estimated ejection fraction was in the range of 60% to 65%. Wall motion was normal; there were no regional wall motion abnormalities. Features are consistent with a pseudonormal left ventricular filling pattern, with concomitant abnormal relaxation and increased filling pressure (grade 2 diastolic dysfunction). - Aortic valve: Trileaflet; normal thickness, mildly calcified leaflets. _____________   History of Present Illness     The patient has hx of HTN however was off antihypertensive for 9 years until last week when her blood pressure was up and her primary care provider started at rest. Today she had a episode of palpitation lasting for 2 hours with dizziness and called EMS who found to have tumor in A. fib with RVR. Started on IV Cardizem and she converted to normal sinus rhythm. This is her third episode in  past one week. Initially it was lasted for 15 minutes then for one hour and today it lasted for 2-3 hours. The patient denies any chest pain or shortness of breath. Patient admits to having intermittent ankle edema. The patient denies orthopnea, PND, syncope, nausea, vomiting, diarrhea or blood in her stool or urine. No recent illness.   Hospital Course     Consultants: none   Pt remained in [place]. Echo OK. Seen by Dr Johnsie Cancel 02/17, BP meds adjusted. Xarelto, added. OP event monitor and f/u in afib clinic, then with MD.  OK for dc  _____________  Discharge Vitals Blood pressure 133/72, pulse 69, temperature 97.7 F (36.5 C), temperature source Oral, resp. rate 18, height 5\' 3"  (1.6 m), weight 151 lb 6.4 oz (68.675 kg), SpO2 98 %.  Filed Weights   02/02/16 2019 02/03/16 0643  Weight: 152 lb 14.4 oz (69.355 kg) 151 lb 6.4 oz (68.675 kg)    Labs & Radiologic Studies     CBC  Recent Labs  02/02/16 1445  WBC 9.3  HGB 14.0  HCT 42.8  MCV 93.9  PLT [id number]   Basic Metabolic Panel  Recent Labs  02/02/16 1445 02/02/16 1538 02/03/16 0421  NA 142  --  144  K 3.6  --  3.9  CL 107  --  108  CO2 21*  --  24  GLUCOSE 98  --  90  BUN 18  --  15  CREATININE 1.10*  --  0.86  CALCIUM 9.7  --  9.7  MG  --  1.9  --    Fasting Lipid Panel  Recent Labs  02/03/16 0421  CHOL 259*  HDL  89  LDLCALC 162*  TRIG 124  CHOLHDL 3.6   Thyroid Function Tests  Recent Labs  02/02/16 1538  TSH 4.323  FREE T4 0.86    Dg Chest 2 View 02/02/2016  CLINICAL DATA:  Tachycardia and hypertension EXAM: CHEST  2 VIEW COMPARISON:  None. FINDINGS: The heart size and mediastinal contours are within normal limits. Both lungs are clear. The visualized skeletal structures are unremarkable. IMPRESSION: No active cardiopulmonary disease. Electronically Signed   By: Inez Catalina M.D.   On: 02/02/2016 15:10   Disposition   Pt is being discharged home today in good condition.  Follow-up Plans & Appointments      Follow-up Information    Follow up with Vidal Schwalbe, MD. Go on 02/09/2016.   Specialty:  Family Medicine   Why:  Follow up @ 1215   Contact information:   [place] [address] [phone]       Follow up with Roderic Palau, NP On 02/09/2016.   Specialties:  Nurse Practitioner, Cardiology   Why:  Atrial fibrillation clinic, see provider at 1:30 pm, please arrive 15 minutes early for paperwork.   Contact information:   [place] [address] [phone]       Follow up with Jenkins Rouge, MD.   Specialty:  Cardiology   Why:  See in 3 months, the office will call.    Contact information:   [id number] [place] [address] [phone]      Discharge Instructions    Diet - low sodium heart healthy    Complete by:  As directed      Increase activity slowly    Complete by:  As directed            Discharge Medications   Current Discharge Medication List    START taking these medications   Details  metoprolol (LOPRESSOR) 50 MG tablet Take 1 tablet (50 mg total) by mouth 2 (two) times daily. Qty: 60 tablet, Refills: 6    rivaroxaban (XARELTO) 20 MG TABS tablet Take 1 tablet (20 mg total) by mouth daily. Qty: 30 tablet, Refills: 6      CONTINUE these medications which have CHANGED   Details  amLODipine (NORVASC) 10 MG tablet Take 1 tablet (10 mg total) by mouth at bedtime. Qty: 30 tablet, Refills: 6      CONTINUE these medications which have NOT CHANGED   Details  cholecalciferol (VITAMIN D) 1000 units tablet Take 1,000 Units by mouth daily.    Coenzyme Q10 (COQ10 PO) Take 1 tablet by mouth daily.    LIVALO 1 MG TABS Take 1 mg by mouth 2 (two) times a week. Refills: 1    omeprazole (PRILOSEC) 40 MG capsule Take 40 mg by mouth daily.          Outstanding Labs/Studies   None  Duration of Discharge Encounter   Greater than 30 minutes including physician time.  Signed, Lenoard Aden 02/03/2016, 10:04 AM

## 2016-02-03 NOTE — Telephone Encounter (Signed)
Paged by nurse, apparently Xarelto need prior authorization and patient has already been discharged by my coworker who has went to the clinic.   I have called [employer] Prior Authorization Line: She has been approved for Xarelto until the end of this year  [id number]  Hilbert Corrigan PA Pager: [phone]

## 2016-02-03 NOTE — Progress Notes (Signed)
Patient ID: Mary Riley, female   DOB: February 10, 1946, 71 y.o.   MRN: [id number]    Subjective:  Denies SSCP, palpitations or Dyspnea One short run PAF  Objective:  Filed Vitals:   02/02/16 1930 02/02/16 1945 02/02/16 2019 02/03/16 0643  BP: 132/76 135/73 161/71   Pulse: 86 81 82 69  Temp:   98 F (36.7 C) 97.7 F (36.5 C)  TempSrc:   Oral   Resp: 15 18 19 18   Height:   5\' 3"  (1.6 m)   Weight:   69.355 kg (152 lb 14.4 oz) 68.675 kg (151 lb 6.4 oz)  SpO2: 98% 97% 100% 98%    Intake/Output from previous day:  Intake/Output Summary (Last 24 hours) at 02/03/16 [id number] Last data filed at 02/02/16 2003  Gross per 24 hour  Intake   1000 ml  Output      0 ml  Net   1000 ml    Physical Exam: Affect appropriate Healthy:  appears stated age HEENT: normal Neck supple with no adenopathy JVP normal no bruits no thyromegaly Lungs clear with no wheezing and good diaphragmatic motion Heart:  S1/S2 no murmur, no rub, gallop or click PMI normal Abdomen: benighn, BS positve, no tenderness, no AAA no bruit.  No HSM or HJR Distal pulses intact with no bruits No edema Neuro non-focal Skin warm and dry No muscular weakness   Lab Results: Basic Metabolic Panel:  Recent Labs  02/02/16 1445 02/02/16 1538 02/03/16 0421  NA 142  --  144  K 3.6  --  3.9  CL 107  --  108  CO2 21*  --  24  GLUCOSE 98  --  90  BUN 18  --  15  CREATININE 1.10*  --  0.86  CALCIUM 9.7  --  9.7  MG  --  1.9  --    Liver Function Tests: No results for input(s): AST, ALT, ALKPHOS, BILITOT, PROT, ALBUMIN in the last 72 hours. No results for input(s): LIPASE, AMYLASE in the last 72 hours. CBC:  Recent Labs  02/02/16 1445  WBC 9.3  HGB 14.0  HCT 42.8  MCV 93.9  PLT 286   Fasting Lipid Panel:  Recent Labs  02/03/16 0421  CHOL 259*  HDL 72  LDLCALC 162*  TRIG 124  CHOLHDL 3.6   Thyroid Function Tests:  Recent Labs  02/02/16 1538  TSH 4.323    Imaging: Dg Chest 2 View  02/02/2016   CLINICAL DATA:  Tachycardia and hypertension EXAM: CHEST  2 VIEW COMPARISON:  None. FINDINGS: The heart size and mediastinal contours are within normal limits. Both lungs are clear. The visualized skeletal structures are unremarkable. IMPRESSION: No active cardiopulmonary disease. Electronically Signed   By: Inez Catalina M.D.   On: 02/02/2016 15:10    Cardiac Studies:  ECG:   NSR no acute ST/T wave changes normal QT   Telemetry:  One short 15 beat episode PAF  Now NSR  Echo:   Medications:   . amLODipine  5 mg Oral QHS  . aspirin EC  81 mg Oral Daily  . cholecalciferol  1,000 Units Oral Daily  . heparin  5,000 Units Subcutaneous 3 times per day  . metoprolol tartrate  50 mg Oral BID  . pantoprazole  40 mg Oral Daily  . pravastatin  20 mg Oral q1800       Assessment/Plan:  PAF:  CHADVASC 3  Start xarelto 20 mg  Echo today d/c latter with event monitor  and outpatient f/u afib clinic then me in 3 months HTN:  Increased amlodipine to 10 mg added lopressor 50 bid improved GERD:  On pantoprazole low carb diet Chol:  Continue statin   Jenkins Rouge 02/03/2016, 7:42 AM

## 2016-02-03 NOTE — Progress Notes (Signed)
Mary Laud, Pa notified of pre authorization

## 2016-02-03 NOTE — Progress Notes (Signed)
1048 02-03-16 Jacqlyn Krauss, RN, BSN [phone] CM did provide pt with the 30 day free Xarelto Card. CM did call [facility] on [address] and medication is available. Xarelto needs prior authorization- MD please call # [phone]. Free 30 day card approved. No further needs from CM at this time.

## 2016-02-03 NOTE — Progress Notes (Signed)
Pts assessment unchanged from this morning. Pt in stable condition and d/c'd via wheelchair to private vehicle. Pt needed authorization with medication. Tried to page Suanne Marker for the authorization but unable to get return of call. I paged Wannetta Sender and she told me to page [facility]. I paged him and awaiting return of call

## 2016-02-03 NOTE — ED Provider Notes (Signed)
I saw and evaluated the patient, reviewed the resident's note and I agree with the findings and plan.   EKG Interpretation   Date/Time:  Thursday February 02 2016 17:01:30 EST Ventricular Rate:  94 PR Interval:  144 QRS Duration: 92 QT Interval:  362 QTC Calculation: 453 R Axis:   65 Text Interpretation:  Sinus rhythm Abnormal R-wave progression, early  transition Borderline T abnormalities, anterior leads Improved tachycardia  compared to last EKG Confirmed by Fani Rotondo MD, Breyon Blass [phone]) on 02/02/2016  5:06:51 PM       70 year old female with history of hypertension who presents with atrial fibrillation with RVR. Has had 3 episodes of palpitations gradually lasting longer than an hour over the course of the past week.  She had An episode today that was non-resolving so she called EMS. On their arrival , she was noted to have atrial fibrillation with RVR. Initially was given 2 doses of adenosine by EMS. Subsequently given a bolus of Cardizem by EMS, and she self converted into normal sinus rhythm. On arrival with sinus tachycardia, no evidence of ischemia. She has not had any chest pain or symptoms of heart failure. No recent GI symptoms or concern for dehydration. Basic blood work shows no major metabolic or electrolyte derangements. Normal thyroid studies. Discussed with cardiology, and they have admitted onto their service for ongoing management.  Forde Dandy, MD 02/03/16 [phone]

## 2016-02-03 NOTE — Care Management Obs Status (Signed)
[place] NOTIFICATION   Patient Details  Name: Mary Riley MRN: [id number] Date of Birth: [birth year]   Medicare Observation Status Notification Given:  Yes    Bethena Roys, RN 02/03/2016, 10:21 AM

## 2016-02-09 ENCOUNTER — Encounter (HOSPITAL_COMMUNITY): Payer: Self-pay | Admitting: Nurse Practitioner

## 2016-02-09 ENCOUNTER — Ambulatory Visit (HOSPITAL_COMMUNITY)
Admit: 2016-02-09 | Discharge: 2016-02-09 | Disposition: A | Discharge status: Home / Self Care | Payer: Medicare Other | Source: Ambulatory Visit | Attending: Nurse Practitioner | Admitting: Nurse Practitioner

## 2016-02-09 VITALS — BP 128/70 | HR 67 | Ht 63.0 in | Wt 155.8 lb

## 2016-02-09 DIAGNOSIS — Z882 Allergy status to sulfonamides status: Secondary | ICD-10-CM | POA: Diagnosis not present

## 2016-02-09 DIAGNOSIS — K5090 Crohn's disease, unspecified, without complications: Secondary | ICD-10-CM | POA: Insufficient documentation

## 2016-02-09 DIAGNOSIS — Z7902 Long term (current) use of antithrombotics/antiplatelets: Secondary | ICD-10-CM | POA: Diagnosis not present

## 2016-02-09 DIAGNOSIS — Z885 Allergy status to narcotic agent status: Secondary | ICD-10-CM | POA: Diagnosis not present

## 2016-02-09 DIAGNOSIS — Z79899 Other long term (current) drug therapy: Secondary | ICD-10-CM | POA: Insufficient documentation

## 2016-02-09 DIAGNOSIS — I4891 Unspecified atrial fibrillation: Secondary | ICD-10-CM | POA: Diagnosis present

## 2016-02-09 DIAGNOSIS — Z8249 Family history of ischemic heart disease and other diseases of the circulatory system: Secondary | ICD-10-CM | POA: Diagnosis not present

## 2016-02-09 DIAGNOSIS — K219 Gastro-esophageal reflux disease without esophagitis: Secondary | ICD-10-CM | POA: Insufficient documentation

## 2016-02-09 DIAGNOSIS — I10 Essential (primary) hypertension: Secondary | ICD-10-CM | POA: Diagnosis not present

## 2016-02-09 NOTE — Progress Notes (Signed)
Patient ID: Mary Riley, female   DOB: [birth year], 70 y.o.   MRN: [id number]     Primary Care Physician: Vidal Schwalbe, MD Referring Physician: [facility] f/u   Mary Riley is a 70 y.o. female with a h/o new onset afib treated overnight at [facility] and started on metoprolol and xarelto. She reports that she is doing well, no further irregular heart beat. We discussed risk vrs benefit of DOAC and bleeding precautions. Echo did not show any structural abnormality. She has no lifestyle risks for afib, minimal caffeine no alcohol, tobacco, alcohol, no snoring. Does water aerobics for exercise.   Today, she denies symptoms of palpitations, chest pain, shortness of breath, orthopnea, PND, lower extremity edema, dizziness, presyncope, syncope, or neurologic sequela. The patient is tolerating medications without difficulties and is otherwise without complaint today.   Past Medical History  Diagnosis Date  . Aneurysm of splenic artery [facility]) Nov 2014    Ref by Dr. Arta Silence  . Allergy   . Hypertension   . GERD (gastroesophageal reflux disease)   . History of Crohn's disease   . Benign hematuria   . Abdominal pain Nov. 2014   Past Surgical History  Procedure Laterality Date  . Knee surgery Right Aug. 2006  . Foot surgery Left 2012  . Colonoscopy  2006 and 2011    Dr. Sammuel Cooper    Current Outpatient Prescriptions  Medication Sig Dispense Refill  . amLODipine (NORVASC) 10 MG tablet Take 1 tablet (10 mg total) by mouth at bedtime. (Patient taking differently: Take 5 mg by mouth at bedtime. ) 30 tablet 6  . cholecalciferol (VITAMIN D) 1000 units tablet Take 1,000 Units by mouth daily.    . Coenzyme Q10 (COQ10 PO) Take 1 tablet by mouth daily.    [name] LIVALO 1 MG TABS Take 1 mg by mouth 2 (two) times a week.  1  . metoprolol (LOPRESSOR) 50 MG tablet Take 1 tablet (50 mg total) by mouth 2 (two) times daily. 60 tablet 6  . omeprazole (PRILOSEC) 40 MG capsule Take 40 mg by mouth daily.    . rivaroxaban  (XARELTO) 20 MG TABS tablet Take 1 tablet (20 mg total) by mouth daily. 30 tablet 6   No current facility-administered medications for this encounter.    Allergies  Allergen Reactions  . Dilaudid [Hydromorphone Hcl] Nausea And Vomiting  . Ace Inhibitors Cough  . Codeine Nausea Only  . Crestor [Rosuvastatin] Other (See Comments)    Joints hurt  . Demerol [Meperidine] Nausea Only  . Lipitor [Atorvastatin] Other (See Comments)    Joints hurt  . Zithromax [Azithromycin]   . Biaxin [Clarithromycin] Rash  . Sulfa Antibiotics Rash    Social History   Social History  . Marital Status: Married    Spouse Name: N/A  . Number of Children: N/A  . Years of Education: N/A   Occupational History  . Not on file.   Social History Main Topics  . Smoking status: Never Smoker   . Smokeless tobacco: Never Used  . Alcohol Use: 0.6 oz/week    1 Glasses of wine per week  . Drug Use: No  . Sexual Activity: Not on file   Other Topics Concern  . Not on file   Social History Narrative    Family History  Problem Relation Age of Onset  . Heart disease Mother 16    Heart Disease before age 89- Brain Aneurysm  . Hypertension Mother   . Aneurysm Mother  brain  . Heart disease Sister     Brain Aneurysm  . Aneurysm Sister     brain   . Cancer Maternal Grandmother     Colon  . Cancer Brother     ROS- All systems are reviewed and negative except as per the HPI above  Physical Exam: Filed Vitals:   02/09/16 1338  BP: 128/70  Pulse: 67  Height: 5\' 3"  (1.6 m)  Weight: 155 lb 12.8 oz (70.67 kg)    GEN- The patient is well appearing, alert and oriented x 3 today.   Head- normocephalic, atraumatic Eyes-  Sclera clear, conjunctiva pink Ears- hearing intact Oropharynx- clear Neck- supple, no JVP Lymph- no cervical lymphadenopathy Lungs- Clear to ausculation bilaterally, normal work of breathing Heart- Regular rate and rhythm, no murmurs, rubs or gallops, PMI not laterally  displaced GI- soft, NT, ND, + BS Extremities- no clubbing, cyanosis, or edema MS- no significant deformity or atrophy Skin- no rash or lesion Psych- euthymic mood, full affect Neuro- strength and sensation are intact  EKG-NSR at 67 bpm, normal ekg EchoLeft ventricle: The cavity size was normal. Systolic function was normal. The estimated ejection fraction was in the range of 60% to 65%. Wall motion was normal; there were no regional wall motion abnormalities. Features are consistent with a pseudonormal left ventricular filling pattern, with concomitant abnormal relaxation and increased filling pressure (grade 2 diastolic dysfunction). - Aortic valve: Trileaflet; normal thickness, mildly calcified leaflets.  Assessment and Plan: 1. New onset afib Continue metoprolol Discussed how to treat afib going forward, can call office for advice, usually like to avoid ER Continue xarelto for chadsvasc score of at least 3 Bleeding precautions discussed  2. HTN Stable  F/u with Dr. Johnsie Cancel in 3 months as scheduled F/u in afib clinic as needed  [place]. Holman Bonsignore, [facility] [address] [place], [place] [address] [phone]

## 2016-04-18 ENCOUNTER — Encounter: Payer: Self-pay | Admitting: Cardiovascular Disease

## 2016-04-18 ENCOUNTER — Ambulatory Visit (INDEPENDENT_AMBULATORY_CARE_PROVIDER_SITE_OTHER): Payer: Medicare Other | Admitting: Cardiovascular Disease

## 2016-04-18 VITALS — BP 126/60 | HR 68 | Ht 63.0 in | Wt 162.1 lb

## 2016-04-18 DIAGNOSIS — I480 Paroxysmal atrial fibrillation: Secondary | ICD-10-CM

## 2016-04-18 MED ORDER — DILTIAZEM HCL ER COATED BEADS 120 MG PO CP24: 120.0000 mg | ORAL_CAPSULE | Freq: Every day | ORAL | Status: DC

## 2016-04-18 NOTE — Patient Instructions (Addendum)
Medication Instructions:  Your physician has recommended you make the following change in your medication:  1-START Cardiazem 120 mg by mouth daily. 2- STOP Metoprolol  Labwork: NONE  Testing/Procedures: NONE  Follow-Up: Your physician wants you to follow-up in: 3 months with Dr. Johnsie Cancel.   If you need a refill on your cardiac medications before your next appointment, please call your pharmacy.

## 2016-04-18 NOTE — Progress Notes (Signed)
Patient ID: Mary Riley, female   DOB: [birth year], 70 y.o.   MRN: [id number]    Primary Care Physician: Vidal Schwalbe, MD Referring Physician: [facility] f/u   Mary Riley is a 70 y.o. female with a history of HTN and aneurysm of splenic artery who brought by EMS to [facility] ED  02/03/16 for evaluation of new onset afib.   HPI: As above. The patient has hx of HTN however was off antihypertensive for 9 years until last week when her blood pressure was up and her primary care provider started at rest. Today she had a episode of palpitation lasting for 2 hours with dizziness and called EMS who found to have tumor in A. fib with RVR. Started on IV Cardizem and she converted to normal sinus rhythm. This is her third episode in past one week. Initially it was lasted for 15 minutes then for one hour and today it lasted for 2-3 hours. The patient denies any chest pain or shortness of breath. Patient admits to having intermittent ankle edema. The patient denies orthopnea, PND, syncope, nausea, vomiting, diarrhea or blood in her stool or urine. No recent illness. Never smoke tobacco or used illicit drugs.  In ED EKG shows normal sinus sinus rhythm with tachycardia and prolonged QT. Review of EMS strips showed A. fib with RVR. Serum creatinine of 1.10. Free T4 normal.  CHADVASC 3 Started on metoprolol and xarelto  Echo:  02/03/16 Study Conclusions  - Procedure narrative: Transthoracic echocardiography. Image  quality was adequate. The study was technically difficult. - Left ventricle: The cavity size was normal. Systolic function was  normal. The estimated ejection fraction was in the range of 60%  to 65%. Wall motion was normal; there were no regional wall  motion abnormalities. Features are consistent with a pseudonormal  left ventricular filling pattern, with concomitant abnormal  relaxation and increased filling pressure (grade 2 diastolic  dysfunction). - Aortic valve: Trileaflet; normal thickness,  mildly calcified  leaflets.  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 Aneurysm of splenic artery [facility]) Nov 2014    Ref by Dr. Arta Silence  . Allergy   . Hypertension   . GERD (gastroesophageal reflux disease)   . History of Crohn's disease   . Benign hematuria   . Abdominal pain Nov. 2014   Past Surgical History  Procedure Laterality Date  . Knee surgery Right Aug. 2006  . Foot surgery Left 2012  . Colonoscopy  2006 and 2011    Dr. Sammuel Cooper    Current Outpatient Prescriptions  Medication Sig Dispense Refill  . cholecalciferol (VITAMIN D) 1000 units tablet Take 1,000 Units by mouth daily.    . Coenzyme Q10 (COQ10 PO) Take 1 tablet by mouth daily.    [name] LIVALO 1 MG TABS Take 1 mg by mouth 3 (three) times a week.   1  . omeprazole (PRILOSEC) 40 MG capsule Take 40 mg by mouth daily.    . rivaroxaban (XARELTO) 20 MG TABS tablet Take 1 tablet (20 mg total) by mouth daily. 30 tablet 6  . diltiazem (CARDIZEM CD) 120 MG 24 hr capsule Take 1 capsule (120 mg total) by mouth daily. 30 capsule 11   No current facility-administered medications for this visit.    Allergies  Allergen Reactions  . Dilaudid [Hydromorphone Hcl] Nausea And Vomiting  . Ace Inhibitors Cough  .  Codeine Nausea Only  . Crestor [Rosuvastatin] Other (See Comments)    Joints hurt  . Demerol [Meperidine] Nausea Only  . Lipitor [Atorvastatin] Other (See Comments)    Joints hurt  . Zithromax [Azithromycin]   . Biaxin [Clarithromycin] Rash  . Sulfa Antibiotics Rash    Social History   Social History  . Marital Status: Married    Spouse Name: N/A  . Number of Children: N/A  . Years of Education: N/A   Occupational History  . Not on file.   Social History Main Topics  . Smoking status: Never Smoker   . Smokeless tobacco: Never Used  . Alcohol Use: 0.6 oz/week    1 Glasses of wine  per week  . Drug Use: No  . Sexual Activity: Not on file   Other Topics Concern  . Not on file   Social History Narrative    Family History  Problem Relation Age of Onset  . Heart disease Mother 24    Heart Disease before age 60- Brain Aneurysm  . Hypertension Mother   . Aneurysm Mother     brain  . Heart disease Sister     Brain Aneurysm  . Aneurysm Sister     brain   . Cancer Maternal Grandmother     Colon  . Cancer Brother     ROS- All systems are reviewed and negative except as per the HPI above  Physical Exam: Filed Vitals:   04/18/16 0854  BP: 126/60  Pulse: 68  Height: 5\' 3"  (1.6 m)  Weight: 73.537 kg (162 lb 1.9 oz)  SpO2: 98%    GEN- The patient is well appearing, alert and oriented x 3 today.   Head- normocephalic, atraumatic Eyes-  Sclera clear, conjunctiva pink Ears- hearing intact Oropharynx- clear Neck- supple, no JVP Lymph- no cervical lymphadenopathy Lungs- Clear to ausculation bilaterally, normal work of breathing Heart- Regular rate and rhythm, no murmurs, rubs or gallops, PMI not laterally displaced GI- soft, NT, ND, + BS Extremities- no clubbing, cyanosis, or edema MS- no significant deformity or atrophy Skin- no rash or lesion Psych- euthymic mood, full affect Neuro- strength and sensation are intact   ECG     -NSR at 67 bpm, normal ekg     Plan:  HTN: Well controlled.  Continue current medications and low sodium Dash type diet.   Afib: stop metoprolol add cardizem  Continue xarelto CHADVASC 3 Anticoagulation:  NOAC see above GERD low carb diet continue prilosec Splenic Aneurysm reveiwed CT Abdomen 01/2016  Mostly thrombosed 13-14 mm stable in size f/u Early  F/U 3 months   Jenkins Rouge

## 2016-05-25 ENCOUNTER — Ambulatory Visit: Payer: Medicare Other | Admitting: Cardiovascular Disease

## 2016-05-25 ENCOUNTER — Telehealth: Payer: Self-pay | Admitting: Cardiovascular Disease

## 2016-05-25 NOTE — Telephone Encounter (Signed)
[place]

## 2016-05-25 NOTE — Telephone Encounter (Signed)
Patient was seen in the ED down in [place], [place]. Patient was given Meterprolol due to her elevated BP. Patient wants to follow-up with Dr. Johnsie Cancel early next week. Patient states she feels better today, but is still having some palpitations.  Made an appointment for Tuesday. Encouraged patient to go to ED if she has any problems over the weekend or call our on-call doctor for advisement. Patient verbalized understanding.

## 2016-05-25 NOTE — Telephone Encounter (Signed)
New Message   Pt Husband calling  Patient c/o Afib:  High priority if patient c/o lightheadedness and shortness of breath.  1. How long have you been having Afib?  6/8  2. Are you currently experiencing lightheadedness and shortness of breath? Unknown sy  3. Have you checked your BP and heart rate? (document readings) 200/?  4. Are you experiencing any other symptoms? unknown  Comments:  Pt Husband states his wife returning home from the beach, while there she experienced palpitation. He states her bp was 200... He wasn't able to give me the more information about bp. Mr. Finan did state that [place] and was given Toprol for her bp.  Pt husband ask that you give a return call to pt. Pt would like to be seen by Dr. Johnsie Cancel today 6/9 once returning home. Please return call to advise.

## 2016-05-29 ENCOUNTER — Encounter: Payer: Self-pay | Admitting: Cardiovascular Disease

## 2016-05-29 ENCOUNTER — Other Ambulatory Visit: Payer: Self-pay

## 2016-05-29 ENCOUNTER — Ambulatory Visit (INDEPENDENT_AMBULATORY_CARE_PROVIDER_SITE_OTHER): Payer: Medicare Other | Admitting: Cardiovascular Disease

## 2016-05-29 VITALS — BP 124/78 | HR 80 | Ht 64.25 in | Wt 164.4 lb

## 2016-05-29 DIAGNOSIS — I480 Paroxysmal atrial fibrillation: Secondary | ICD-10-CM | POA: Diagnosis not present

## 2016-05-29 MED ORDER — RIVAROXABAN 20 MG PO TABS: 20.0000 mg | ORAL_TABLET | Freq: Every day | ORAL | Status: DC

## 2016-05-29 NOTE — Progress Notes (Signed)
Patient ID: [facility], female   DOB: [birth year], 70 y.o.   MRN: [id number]    Primary Care Physician: Mary Schwalbe, MD Referring Physician: [facility] f/u   Mary [facility] is a 70 y.o. female with a history of HTN and aneurysm of splenic artery who brought by EMS to [facility]  02/03/16 for evaluation of new onset afib.   HPI: As above. The patient has hx of HTN however was off antihypertensive for 9 years until last week when her blood pressure was up and her primary care provider started at rest. Today she had a episode of palpitation lasting for 2 hours with dizziness and called EMS who found to have tumor in A. fib with RVR. Started on IV Cardizem and she converted to normal sinus rhythm. This is her third episode in past one week. Initially it was lasted for 15 minutes then for one hour and today it lasted for 2-3 hours. The patient denies any chest pain or shortness of breath. Patient admits to having intermittent ankle edema. The patient denies orthopnea, PND, syncope, nausea, vomiting, diarrhea or blood in her stool or urine. No recent illness. Never smoke tobacco or used illicit drugs.  In ED EKG shows normal sinus sinus rhythm with tachycardia and prolonged QT. Review of EMS strips showed A. fib with RVR. Serum creatinine of 1.10. Free T4 normal.  CHADVASC 3 Started on metoprolol and xarelto  Echo:  02/03/16 Study Conclusions  - Procedure narrative: Transthoracic echocardiography. Image  quality was adequate. The study was technically difficult. - Left ventricle: The cavity size was normal. Systolic function was  normal. The estimated ejection fraction was in the range of 60%  to 65%. Wall motion was normal; there were no regional wall  motion abnormalities. Features are consistent with a pseudonormal  left ventricular filling pattern, with concomitant abnormal  relaxation and increased filling pressure (grade 2 diastolic  dysfunction). - Aortic valve: Trileaflet; normal thickness,  mildly calcified  leaflets.  Gained weight and LE edema thinks its the metroprolol this was stopped last visit and placed on cardizem However seen in ER last week 05/24/16  [facility] like she had HTN.  EMS indicated PAF but I reviewed her rhythm strips and she was in NSR   She restarted metoprolol  ? Edema wit norvasc.  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 Aneurysm of splenic artery [facility]) Nov 2014    Ref by Dr. Arta [facility]  . Allergy   . Hypertension   . GERD (gastroesophageal reflux disease)   . History of Crohn's disease   . Benign hematuria   . Abdominal pain Nov. 2014   Past Surgical History  Procedure Laterality Date  . Knee surgery Right Aug. 2006  . Foot surgery Left 2012  . Colonoscopy  2006 and 2011    Dr. Sammuel [facility]    Current Outpatient Prescriptions  Medication Sig Dispense Refill  . amLODipine (NORVASC) 10 MG tablet Take 0.5 tablets by mouth daily.  6  . cholecalciferol (VITAMIN D) 1000 units tablet Take 1,000 Units by mouth daily.    . Coenzyme Q10 (COQ10 PO) Take 1 tablet by mouth daily.    [name] LIVALO 1 MG TABS Take 1 mg by mouth 3 (three) times a week.   1  . metoprolol (LOPRESSOR) 50 MG tablet Take 1 tablet by mouth 2 (two) times daily.  4  . omeprazole (PRILOSEC) 40 MG capsule Take 40 mg by mouth daily.     No current facility-administered medications for this visit.    Allergies  Allergen Reactions  . Dilaudid [Hydromorphone Hcl] Nausea And Vomiting  . Ace Inhibitors Cough  . Codeine Nausea Only  . Crestor [Rosuvastatin] Other (See Comments)    Joints hurt  . Demerol [Meperidine] Nausea Only  . Lipitor [Atorvastatin] Other (See Comments)    Joints hurt  . Zithromax [Azithromycin]   . Biaxin [Clarithromycin] Rash  . Sulfa Antibiotics Rash    Social History   Social History  . Marital Status: Married    Spouse Name: N/A  . Number of Children:  N/A  . Years of Education: N/A   Occupational History  . Not on file.   Social History Main Topics  . Smoking status: Never Smoker   . Smokeless tobacco: Never Used  . Alcohol Use: 0.6 oz/week    1 Glasses of wine per week  . Drug Use: No  . Sexual Activity: Not on file   Other Topics Concern  . Not on file   Social History Narrative    Family History  Problem Relation Age of Onset  . Heart disease Mother 34    Heart Disease before age 29- Brain Aneurysm  . Hypertension Mother   . Aneurysm Mother     brain  . Heart disease Sister     Brain Aneurysm  . Aneurysm Sister     brain   . Cancer Maternal Grandmother     Colon  . Cancer Brother     ROS- All systems are reviewed and negative except as per the HPI above  Physical Exam: Filed Vitals:   05/29/16 1504  BP: 124/78  Pulse: 80  Height: 5' 4.25" (1.632 m)  Weight: 74.571 kg (164 lb 6.4 oz)  SpO2: 98%    GEN- The patient is well appearing, alert and oriented x 3 today.   Head- normocephalic, atraumatic Eyes-  Sclera clear, conjunctiva pink Ears- hearing intact Oropharynx- clear Neck- supple, no JVP Lymph- no cervical lymphadenopathy Lungs- Clear to ausculation bilaterally, normal work of breathing Heart- Regular rate and rhythm, no murmurs, rubs or gallops, PMI not laterally displaced GI- soft, NT, ND, + BS Extremities- no clubbing, cyanosis, or edema MS- no significant deformity or atrophy Skin- no rash or lesion Psych- euthymic mood, full affect Neuro- strength and sensation are intact   ECG     -NSR at 67 bpm, normal ekg     Plan:  HTN: Well controlled.  offerred to change norvasc to verapamil for ? Edema but she does not want to change now   Afib:   Non compliant with xarelto Called script back in She will restart  CHADVASC 3 Anticoagulation:  NOAC see above GERD low carb diet continue prilosec Splenic Aneurysm reveiwed CT Abdomen 01/2016  Mostly thrombosed 13-14 mm stable in size f/u  Early  F/U 3 months   [facility]

## 2016-05-29 NOTE — Addendum Note (Signed)
Addended by: Aris Georgia, Tovah Slavick L on: 05/29/2016 03:54 PM   Modules accepted: Orders

## 2016-05-29 NOTE — Patient Instructions (Addendum)
Medication Instructions:  Your physician has recommended you make the following change in your medication:  You are to start back on your xarelto 20 mg by mouth daily.  Labwork: NONE  Testing/Procedures: NONE  Follow-Up: As already scheduled.   If you need a refill on your cardiac medications before your next appointment, please call your pharmacy.

## 2016-07-25 NOTE — Progress Notes (Signed)
Patient ID: [facility], female   DOB: [birth year], 70 y.o.   MRN: [id number]    Primary Care Physician: Vidal Schwalbe, MD Referring Physician: [facility] f/u   Delayla Fatheree is a 70 y.o. [name] female with a history of HTN and aneurysm of splenic artery who brought by EMS to [facility] ED  02/03/16 for evaluation of new onset afib.   HPI: As above. The patient has hx of HTN however was off antihypertensive for 9 years until last week when her blood pressure was up and her primary care provider started at rest. Today she had a episode of palpitation lasting for 2 hours with dizziness and called EMS who found to have tumor in A. fib with RVR. Started on IV Cardizem and she converted to normal sinus rhythm. This is her third episode in past one week. Initially it was lasted for 15 minutes then for one hour and today it lasted for 2-3 hours. The patient denies any chest pain or shortness of breath. Patient admits to having intermittent ankle edema. The patient denies orthopnea, PND, syncope, nausea, vomiting, diarrhea or blood in her stool or urine. No recent illness. Never smoke tobacco or used illicit drugs.  In ED EKG shows normal sinus sinus rhythm with tachycardia and prolonged QT. Review of EMS strips showed A. fib with RVR. Serum creatinine of 1.10. Free T4 normal.  CHADVASC 3 Started on metoprolol and xarelto  Echo:  02/03/16 Study Conclusions  - Procedure narrative: Transthoracic echocardiography. Image  quality was adequate. The study was technically difficult. - Left ventricle: The cavity size was normal. Systolic function was  normal. The estimated ejection fraction was in the range of 60%  to 65%. Wall motion was normal; there were no regional wall  motion abnormalities. Features are consistent with a pseudonormal  left ventricular filling pattern, with concomitant abnormal  relaxation and increased filling pressure (grade 2 diastolic  dysfunction). - Aortic valve: Trileaflet; normal  thickness, mildly calcified  leaflets.  Gained weight and LE edema thinks its the metroprolol this was stopped last visit and placed on cardizem However seen in ER last week 05/24/16  [facility] like she had HTN.  EMS indicated PAF but I reviewed her rhythm strips and she was in NSR   She restarted metoprolol  ? Edema wit norvasc.  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 Abdominal pain Nov. 2014  . Allergy   . Aneurysm of splenic artery [facility]) Nov 2014   Ref by Dr. Arta Silence  . Benign hematuria   . GERD (gastroesophageal reflux disease)   . History of Crohn's disease   . Hypertension    Past Surgical History:  Procedure Laterality Date  . COLONOSCOPY  2006 and 2011   Dr. Sammuel Cooper  . FOOT SURGERY Left 2012  . KNEE SURGERY Right Aug. 2006    Current Outpatient Prescriptions  Medication Sig Dispense Refill  . amLODipine (NORVASC) 5 MG tablet Take 5 mg by mouth daily.    . cholecalciferol (VITAMIN D) 1000 units tablet Take 1,000 Units by mouth daily.    . Coenzyme Q10 (COQ10 PO) Take 1 tablet by mouth daily.    [name] LIVALO 1 MG TABS Take 1 mg by mouth 3 (three) times a week.   1  . metoprolol (LOPRESSOR) 50 MG tablet Take 1 tablet by mouth 2 (two) times daily.  4  .  omeprazole (PRILOSEC) 40 MG capsule Take 40 mg by mouth daily.    . rivaroxaban (XARELTO) 20 MG TABS tablet Take 1 tablet (20 mg total) by mouth daily with supper. 30 tablet 11   No current facility-administered medications for this visit.     Allergies  Allergen Reactions  . Dilaudid [Hydromorphone Hcl] Nausea And Vomiting  . Ace Inhibitors Cough  . Codeine Nausea Only  . Crestor [Rosuvastatin] Other (See Comments)    Joints hurt  . Demerol [Meperidine] Nausea Only  . Lipitor [Atorvastatin] Other (See Comments)    Joints hurt  . Zithromax [Azithromycin]   . Biaxin [Clarithromycin] Rash  . Sulfa Antibiotics  Rash    Social History   Social History  . Marital status: Married    Spouse name: N/A  . Number of children: N/A  . Years of education: N/A   Occupational History  . Not on file.   Social History Main Topics  . Smoking status: Never Smoker  . Smokeless tobacco: Never Used  . Alcohol use 0.6 oz/week    1 Glasses of wine per week  . Drug use: No  . Sexual activity: Not on file   Other Topics Concern  . Not on file   Social History Narrative  . No narrative on file    Family History  Problem Relation Age of Onset  . Heart disease Mother 77    Heart Disease before age 34- Brain Aneurysm  . Hypertension Mother   . Aneurysm Mother     brain  . Heart disease Sister     Brain Aneurysm  . Aneurysm Sister     brain   . Cancer Maternal Grandmother     Colon  . Cancer Brother     ROS- All systems are reviewed and negative except as per the HPI above  Physical Exam: Vitals:   07/31/16 0912  BP: 110/62  Pulse: 71  SpO2: 97%  Weight: 161 lb (73 kg)  Height: 5\' 4"  (1.626 m)    GEN- The patient is well appearing, alert and oriented x 3 today.   Head- normocephalic, atraumatic Eyes-  Sclera clear, conjunctiva pink Ears- hearing intact Oropharynx- clear Neck- supple, no JVP Lymph- no cervical lymphadenopathy Lungs- Clear to ausculation bilaterally, normal work of breathing Heart- Regular rate and rhythm, no murmurs, rubs or gallops, PMI not laterally displaced GI- soft, NT, ND, + BS Extremities- no clubbing, cyanosis, or edema MS- no significant deformity or atrophy Skin- no rash or lesion Psych- euthymic mood, full affect Neuro- strength and sensation are intact   ECG     - 02/09/16  NSR at 67 bpm, normal ekg     Plan:  HTN: Well controlled.  offerred to change norvasc to verapamil for ? Edema but she does not want to change now  Will avoid pseudofed during ragweed season discussed taking allegra, or claritin  Afib:   Non compliant with xarelto Called  script back in She will restart  CHADVASC 3 Anticoagulation:  NOAC see above GERD low carb diet continue prilosec Splenic Aneurysm reveiwed CT Abdomen 01/2016  Mostly thrombosed 13-14 mm stable in size f/u Early  F/U 6 months will have lab work with Harlan Stains next month   [employer]

## 2016-07-31 ENCOUNTER — Ambulatory Visit (INDEPENDENT_AMBULATORY_CARE_PROVIDER_SITE_OTHER): Payer: Medicare Other | Admitting: Cardiovascular Disease

## 2016-07-31 ENCOUNTER — Encounter: Payer: Self-pay | Admitting: Cardiovascular Disease

## 2016-07-31 VITALS — BP 110/62 | HR 71 | Ht 64.0 in | Wt 161.0 lb

## 2016-07-31 DIAGNOSIS — I480 Paroxysmal atrial fibrillation: Secondary | ICD-10-CM

## 2016-07-31 NOTE — Patient Instructions (Addendum)

## 2016-11-26 ENCOUNTER — Ambulatory Visit: Payer: Medicare Other | Admitting: Cardiovascular Disease

## 2017-01-04 IMAGING — DX DG CHEST 2V
2 series · 2 of 2 positions shown · non-contrast
Comparison: None.

CLINICAL DATA: Tachycardia and hypertension

EXAM:
CHEST  2 VIEW

[chest pa]
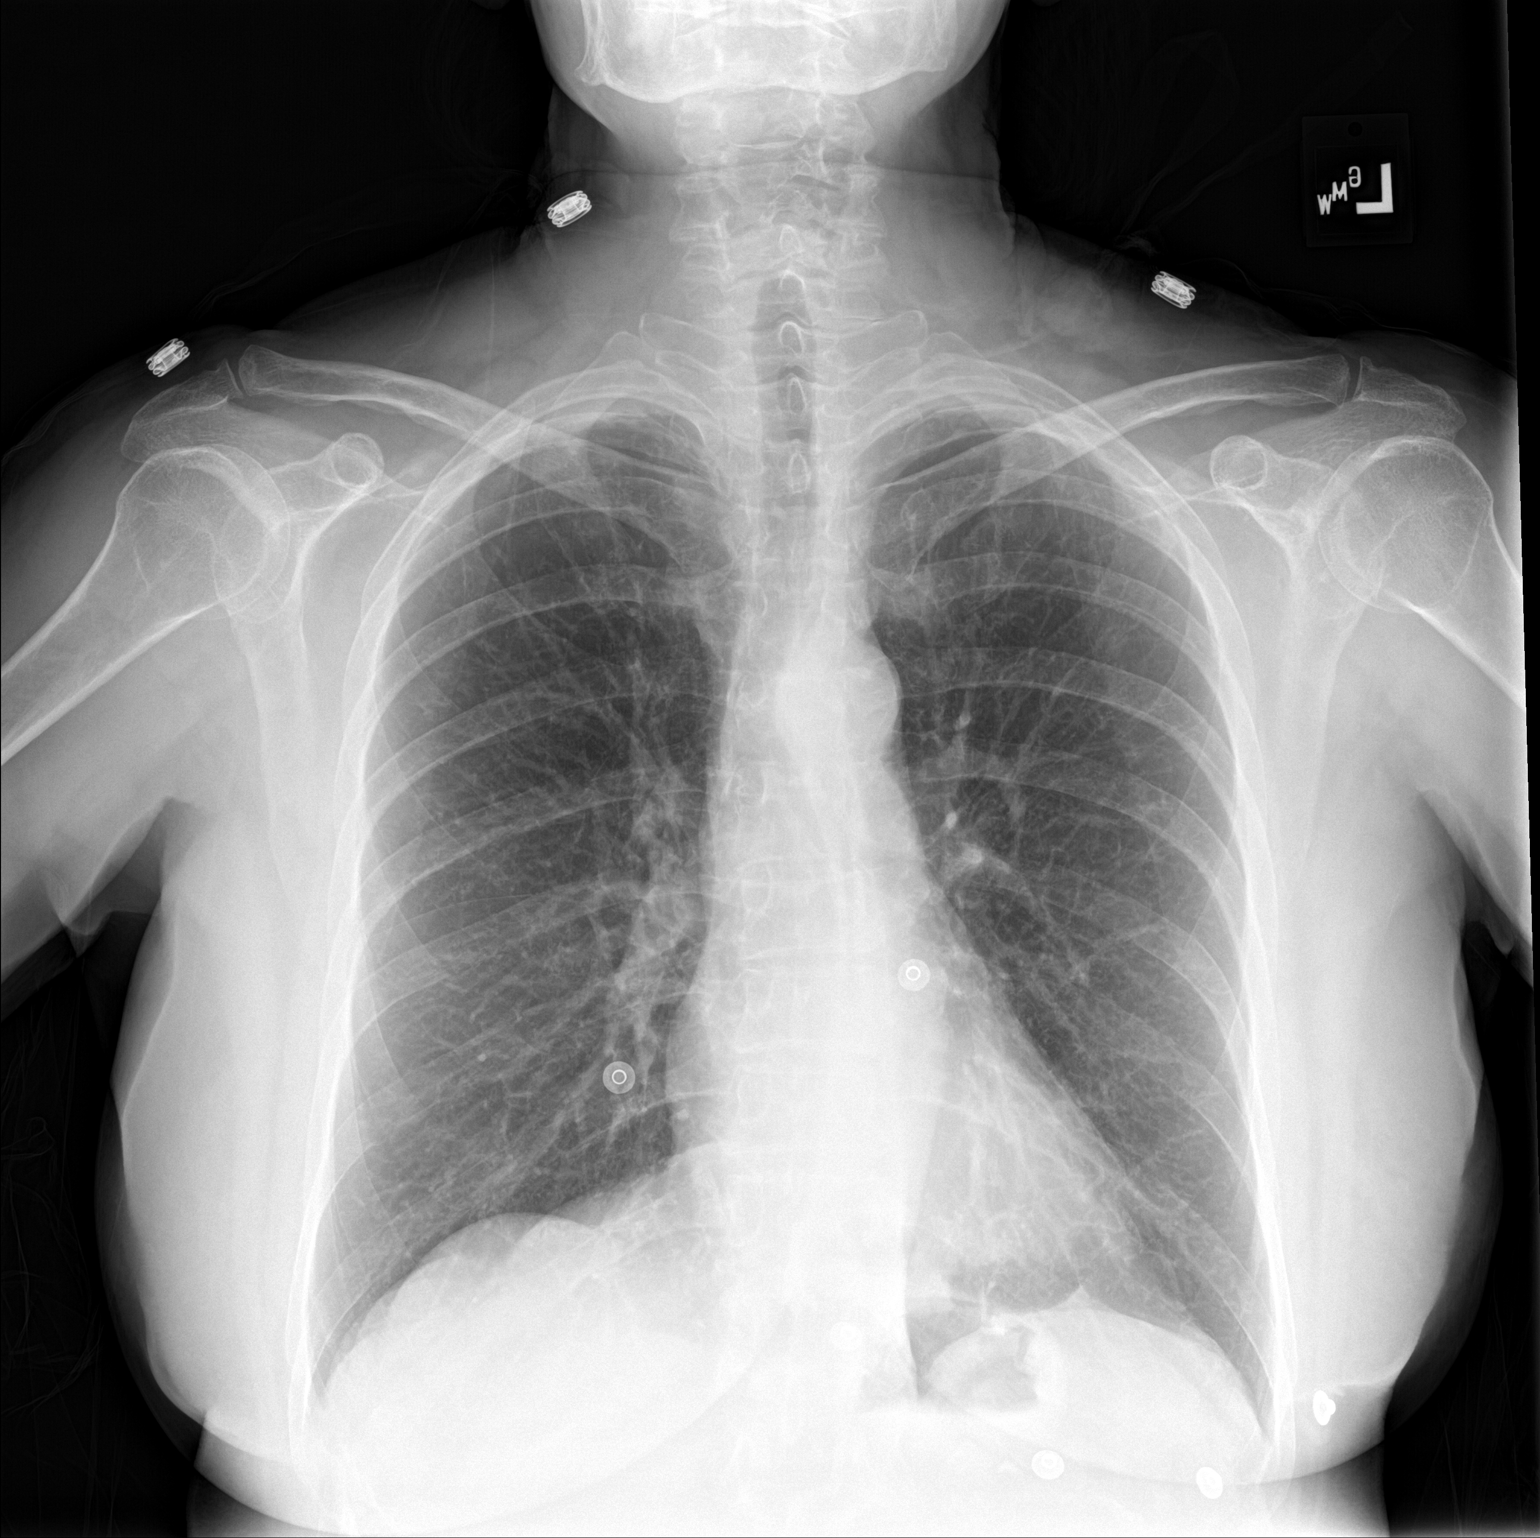

[chest lat]
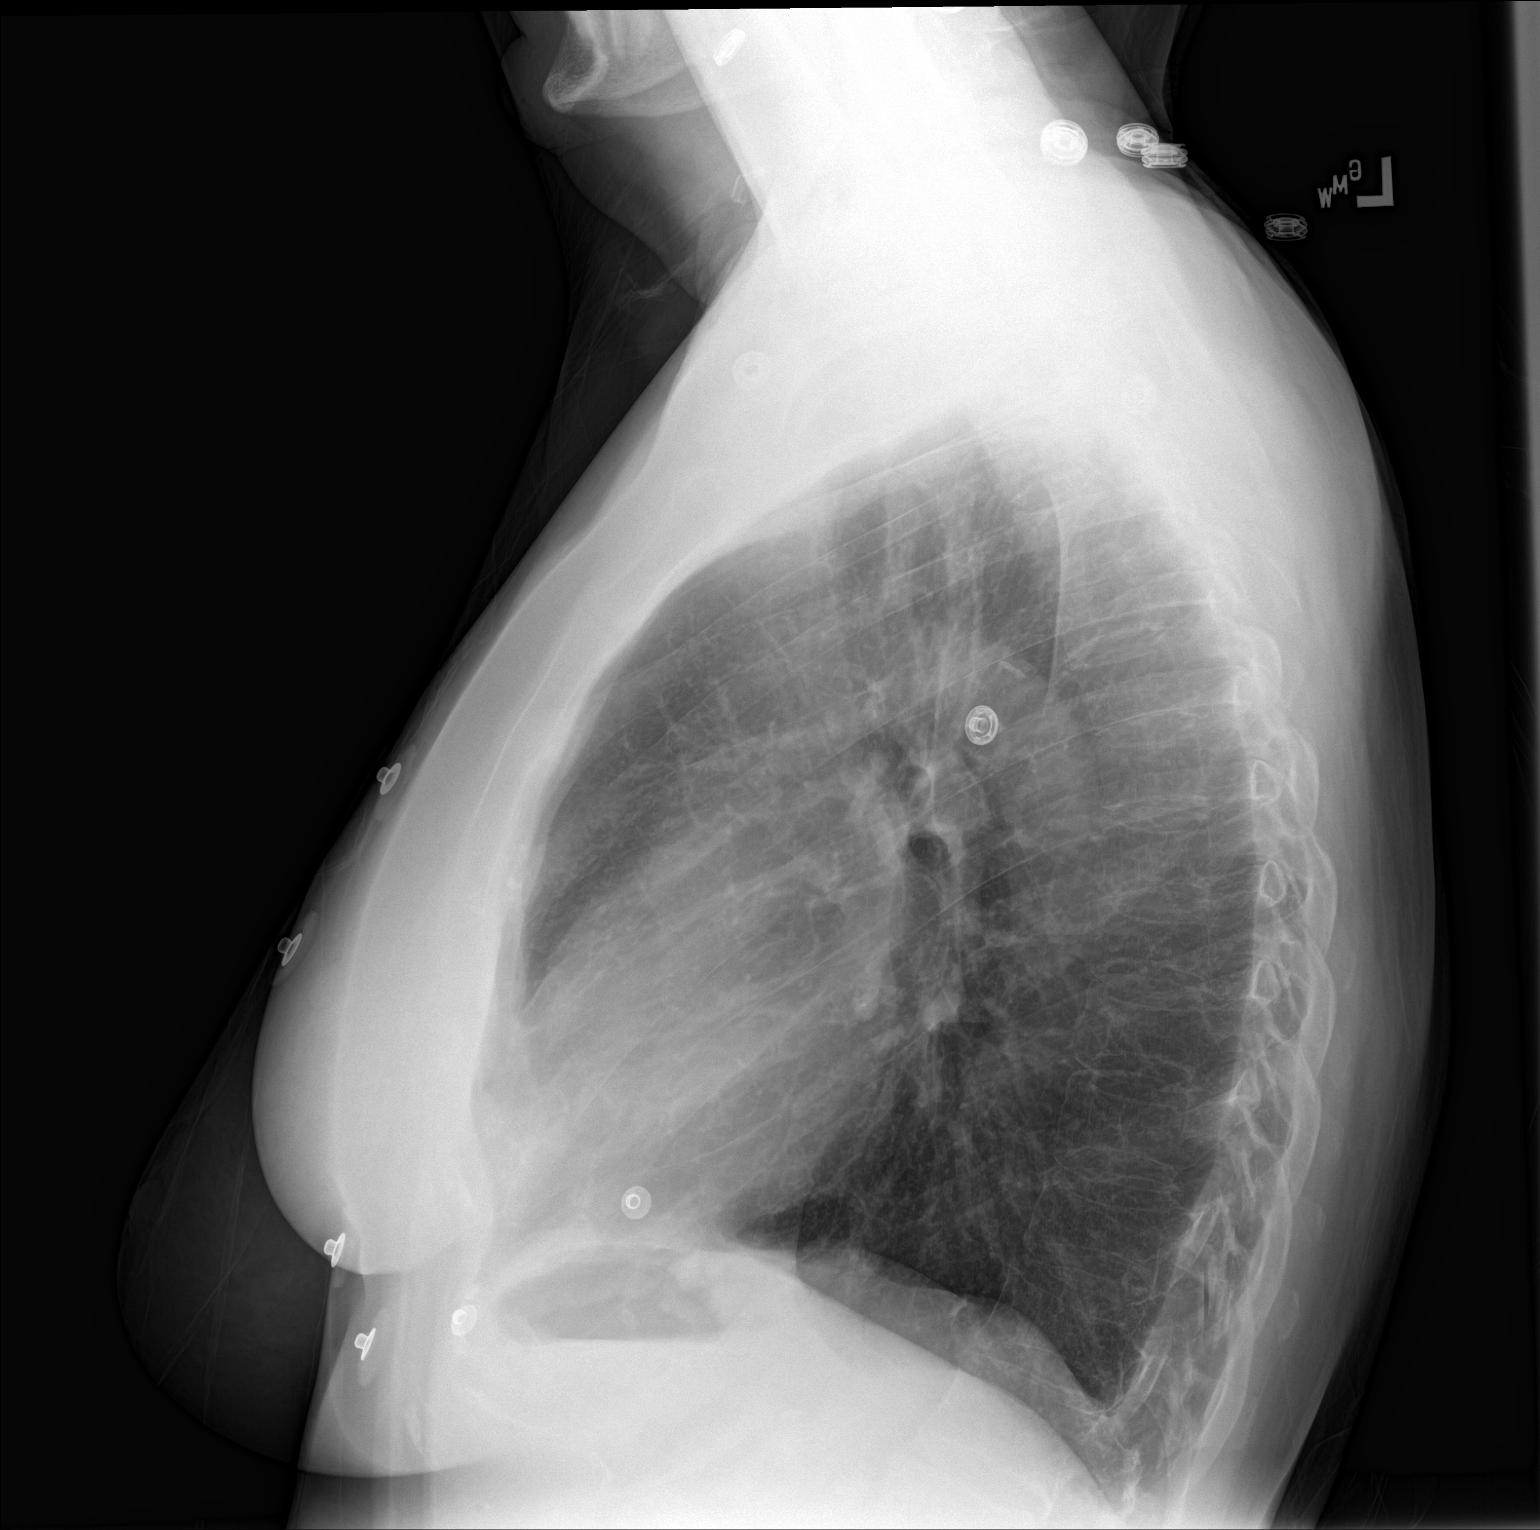

[2 of 2 positions shown; findings below may reference images not displayed]

FINDINGS: The heart size and mediastinal contours are within normal limits.
Both lungs are clear. The visualized skeletal structures are
unremarkable.
IMPRESSION: No active cardiopulmonary disease.

## 2017-01-30 ENCOUNTER — Other Ambulatory Visit: Payer: Self-pay | Admitting: Family Medicine

## 2017-01-30 DIAGNOSIS — I728 Aneurysm of other specified arteries: Secondary | ICD-10-CM

## 2017-01-31 NOTE — Progress Notes (Signed)
Patient ID: [facility], female   DOB: 1946/11/20, 71 y.o.   MRN: [id number]    Primary Care Physician: Vidal Schwalbe, MD Referring Physician: [facility] f/u   [facility] is a 71 y.o. [name] female with a history of HTN and aneurysm of splenic artery who brought by EMS to [facility] ED  02/03/16 for evaluation of new onset afib.   HPI: As above. The patient has hx of HTN however was off antihypertensive for 9 years until last week when her blood pressure was up and her primary care provider started at rest. Today she had a episode of palpitation lasting for 2 hours with dizziness and called EMS who found to have tumor in A. fib with RVR. Started on IV Cardizem and she converted to normal sinus rhythm. This is her third episode in past one week. Initially it was lasted for 15 minutes then for one hour and today it lasted for 2-3 hours. The patient denies any chest pain or shortness of breath. Patient admits to having intermittent ankle edema. The patient denies orthopnea, PND, syncope, nausea, vomiting, diarrhea or blood in her stool or urine. No recent illness. Never smoke tobacco or used illicit drugs.  In ED EKG showed  normal sinus sinus rhythm with tachycardia and prolonged QT. Review of EMS strips showed A. fib with RVR. Serum creatinine of 1.10. Free T4 normal.  CHADVASC 3 Started on metoprolol and xarelto  Echo:  02/03/16 Study Conclusions  - Procedure narrative: Transthoracic echocardiography. Image  quality was adequate. The study was technically difficult. - Left ventricle: The cavity size was normal. Systolic function was  normal. The estimated ejection fraction was in the range of 60%  to 65%. Wall motion was normal; there were no regional wall  motion abnormalities. Features are consistent with a pseudonormal  left ventricular filling pattern, with concomitant abnormal  relaxation and increased filling pressure (grade 2 diastolic  dysfunction). - Aortic valve: Trileaflet; normal  thickness, mildly calcified  leaflets.  Gained weight and LE edema thinks its the metroprolol this was stopped last visit and placed on cardizem However seen in ER  05/24/16  [facility] like she had HTN.  EMS indicated PAF but I reviewed her rhythm strips and she was in NSR   She restarted metoprolol  ? Edema wit norvasc.  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 Abdominal pain Nov. 2014  . Allergy   . Aneurysm of splenic artery [facility]) Nov 2014   Ref by Dr. Arta Silence  . Benign hematuria   . GERD (gastroesophageal reflux disease)   . History of Crohn's disease   . Hypertension    Past Surgical History:  Procedure Laterality Date  . COLONOSCOPY  2006 and 2011   Dr. Sammuel Cooper  . FOOT SURGERY Left 2012  . KNEE SURGERY Right Aug. 2006    Current Outpatient Prescriptions  Medication Sig Dispense Refill  . amLODipine (NORVASC) 5 MG tablet Take 5 mg by mouth daily.    . cholecalciferol (VITAMIN D) 1000 units tablet Take 1,000 Units by mouth daily.    . Coenzyme Q10 (COQ10 PO) Take 1 tablet by mouth daily.    [name] LIVALO 1 MG TABS Take 1 mg by mouth 3 (three) times a week.   1  . metoprolol (LOPRESSOR) 50 MG tablet Take  1 tablet by mouth 2 (two) times daily.  4  . omeprazole (PRILOSEC) 40 MG capsule Take 40 mg by mouth daily.    . rivaroxaban (XARELTO) 20 MG TABS tablet Take 1 tablet (20 mg total) by mouth daily with supper. 30 tablet 11   No current facility-administered medications for this visit.     Allergies  Allergen Reactions  . Dilaudid [Hydromorphone Hcl] Nausea And Vomiting  . Ace Inhibitors Cough  . Codeine Nausea Only  . Crestor [Rosuvastatin] Other (See Comments)    Joints hurt  . Demerol [Meperidine] Nausea Only  . Lipitor [Atorvastatin] Other (See Comments)    Joints hurt  . Zithromax [Azithromycin]     unknown   . Biaxin [Clarithromycin] Rash  . Sulfa Antibiotics Rash    Social History   Social History  . Marital status: Married    Spouse name: N/A  . Number of children: N/A  . Years of education: N/A   Occupational History  . Not on file.   Social History Main Topics  . Smoking status: Never Smoker  . Smokeless tobacco: Never Used  . Alcohol use 0.6 oz/week    1 Glasses of wine per week  . Drug use: No  . Sexual activity: Not on file   Other Topics Concern  . Not on file   Social History Narrative  . No narrative on file    Family History  Problem Relation Age of Onset  . Heart disease Mother 51    Heart Disease before age 57- Brain Aneurysm  . Hypertension Mother   . Aneurysm Mother     brain  . Heart disease Sister     Brain Aneurysm  . Aneurysm Sister     brain   . Cancer Maternal Grandmother     Colon  . Cancer Brother     ROS- All systems are reviewed and negative except as per the HPI above  Physical Exam: Vitals:   02/08/17 1056  BP: 140/80  Pulse: 79  SpO2: 96%  Weight: 167 lb (75.8 kg)  Height: 5\' 4"  (1.626 m)    GEN- The patient is well appearing, alert and oriented x 3 today.   Head- normocephalic, atraumatic Eyes-  Sclera clear, conjunctiva pink Ears- hearing intact Oropharynx- clear Neck- supple, no JVP Lymph- no cervical lymphadenopathy Lungs- Clear to ausculation bilaterally, normal work of breathing Heart- Regular rate and rhythm, no murmurs, rubs or gallops, PMI not laterally displaced GI- soft, NT, ND, + BS Extremities- no clubbing, cyanosis, or edema MS- no significant deformity or atrophy Skin- no rash or lesion Psych- euthymic mood, full affect Neuro- strength and sensation are intact   ECG     - 02/09/16  NSR at 67 bpm, normal ekg  02/08/17 SR rate 73 normal     Plan:  HTN: Well controlled.  offerred to change norvasc to verapamil for ? Edema but she does not want to change now    Afib:   Non compliant with xarelto  Called script back in She will restart  CHADVASC 3 Anticoagulation:  NOAC see above  GERD low carb diet continue prilosec  Splenic Aneurysm reveiwed CT Abdomen 01/2016  Mostly thrombosed 13-14 mm stable in size f/u Early  Chol:  On statin labs with primary next week Harlan Stains  Lab Results  Component Value Date   Reader 162 (H) 02/03/2016      Jenkins Rouge

## 2017-02-08 ENCOUNTER — Encounter: Payer: Self-pay | Admitting: Cardiovascular Disease

## 2017-02-08 ENCOUNTER — Ambulatory Visit (INDEPENDENT_AMBULATORY_CARE_PROVIDER_SITE_OTHER): Payer: Medicare Other | Admitting: Cardiovascular Disease

## 2017-02-08 VITALS — BP 140/80 | HR 79 | Ht 64.0 in | Wt 167.0 lb

## 2017-02-08 DIAGNOSIS — I480 Paroxysmal atrial fibrillation: Secondary | ICD-10-CM | POA: Diagnosis not present

## 2017-02-08 MED ORDER — RIVAROXABAN 20 MG PO TABS: 20.0000 mg | ORAL_TABLET | Freq: Every day | ORAL | 3 refills | Status: DC

## 2017-02-08 MED ORDER — METOPROLOL TARTRATE 50 MG PO TABS: 50.0000 mg | ORAL_TABLET | Freq: Two times a day (BID) | ORAL | 3 refills | Status: DC

## 2017-02-08 MED ORDER — AMLODIPINE BESYLATE 5 MG PO TABS: 5.0000 mg | ORAL_TABLET | Freq: Every day | ORAL | 3 refills | Status: DC

## 2017-02-08 NOTE — Patient Instructions (Addendum)

## 2017-02-08 NOTE — Addendum Note (Signed)
Addended by: Roberts Gaudy on: 02/08/2017 11:23 AM   Modules accepted: Orders

## 2017-02-26 ENCOUNTER — Inpatient Hospital Stay
Admission: RE | Admit: 2017-02-26 | Discharge: 2017-02-26 | Disposition: A | Discharge status: Home / Self Care | Payer: Medicare Other | Source: Ambulatory Visit | Attending: Family Medicine | Admitting: Family Medicine

## 2017-03-11 ENCOUNTER — Encounter (HOSPITAL_COMMUNITY): Payer: Self-pay | Admitting: Nurse Practitioner

## 2017-03-11 ENCOUNTER — Emergency Department (HOSPITAL_COMMUNITY): Payer: Medicare Other

## 2017-03-11 ENCOUNTER — Observation Stay (HOSPITAL_COMMUNITY)
Admission: EM | Admit: 2017-03-11 | Discharge: 2017-03-12 | Disposition: A | Discharge status: Home / Self Care | Payer: Medicare Other | Attending: Internal Medicine | Admitting: Internal Medicine

## 2017-03-11 DIAGNOSIS — Z7901 Long term (current) use of anticoagulants: Secondary | ICD-10-CM | POA: Diagnosis not present

## 2017-03-11 DIAGNOSIS — R002 Palpitations: Secondary | ICD-10-CM | POA: Diagnosis present

## 2017-03-11 DIAGNOSIS — R55 Syncope and collapse: Secondary | ICD-10-CM

## 2017-03-11 DIAGNOSIS — Z888 Allergy status to other drugs, medicaments and biological substances status: Secondary | ICD-10-CM | POA: Insufficient documentation

## 2017-03-11 DIAGNOSIS — I160 Hypertensive urgency: Secondary | ICD-10-CM | POA: Diagnosis not present

## 2017-03-11 DIAGNOSIS — Z8719 Personal history of other diseases of the digestive system: Secondary | ICD-10-CM | POA: Diagnosis not present

## 2017-03-11 DIAGNOSIS — Z882 Allergy status to sulfonamides status: Secondary | ICD-10-CM | POA: Insufficient documentation

## 2017-03-11 DIAGNOSIS — Z885 Allergy status to narcotic agent status: Secondary | ICD-10-CM | POA: Insufficient documentation

## 2017-03-11 DIAGNOSIS — J0120 Acute ethmoidal sinusitis, unspecified: Secondary | ICD-10-CM

## 2017-03-11 DIAGNOSIS — I10 Essential (primary) hypertension: Secondary | ICD-10-CM

## 2017-03-11 DIAGNOSIS — K219 Gastro-esophageal reflux disease without esophagitis: Secondary | ICD-10-CM | POA: Diagnosis not present

## 2017-03-11 DIAGNOSIS — J322 Chronic ethmoidal sinusitis: Secondary | ICD-10-CM | POA: Insufficient documentation

## 2017-03-11 DIAGNOSIS — Z9114 Patient's other noncompliance with medication regimen: Secondary | ICD-10-CM | POA: Insufficient documentation

## 2017-03-11 DIAGNOSIS — Z79899 Other long term (current) drug therapy: Secondary | ICD-10-CM | POA: Diagnosis not present

## 2017-03-11 DIAGNOSIS — I480 Paroxysmal atrial fibrillation: Secondary | ICD-10-CM | POA: Diagnosis not present

## 2017-03-11 DIAGNOSIS — R51 Headache: Secondary | ICD-10-CM | POA: Diagnosis present

## 2017-03-11 LAB — CBC
HCT: 37.4 % (ref 36.0–46.0)
Hemoglobin: 12.1 g/dL (ref 12.0–15.0)
MCH: 30.3 pg (ref 26.0–34.0)
MCHC: 32.4 g/dL (ref 30.0–36.0)
MCV: 93.7 fL (ref 78.0–100.0)
PLATELETS: 256 10*3/uL (ref 150–400)
RBC: 3.99 MIL/uL (ref 3.87–5.11)
RDW: 12.8 % (ref 11.5–15.5)
WBC: 6.7 10*3/uL (ref 4.0–10.5)

## 2017-03-11 LAB — BASIC METABOLIC PANEL
Anion gap: 10 (ref 5–15)
BUN: 20 mg/dL (ref 6–20)
CALCIUM: 9.4 mg/dL (ref 8.9–10.3)
CO2: 24 mmol/L (ref 22–32)
CREATININE: 1.12 mg/dL — AB (ref 0.44–1.00)
Chloride: 105 mmol/L (ref 101–111)
GFR calc Af Amer: 56 mL/min — ABNORMAL LOW (ref >60)
GFR calc non Af Amer: 48 mL/min — ABNORMAL LOW (ref >60)
Glucose, Bld: 115 mg/dL — ABNORMAL HIGH (ref 65–99)
Potassium: 3.8 mmol/L (ref 3.5–5.1)
Sodium: 139 mmol/L (ref 135–145)

## 2017-03-11 LAB — URINALYSIS, ROUTINE W REFLEX MICROSCOPIC
Bilirubin Urine: NEGATIVE
GLUCOSE, UA: NEGATIVE mg/dL
HGB URINE DIPSTICK: NEGATIVE
Ketones, ur: NEGATIVE mg/dL
Leukocytes, UA: NEGATIVE
Nitrite: NEGATIVE
PROTEIN: NEGATIVE mg/dL
SPECIFIC GRAVITY, URINE: 1.009 (ref 1.005–1.030)
pH: 6 (ref 5.0–8.0)

## 2017-03-11 LAB — PROTIME-INR
INR: 0.98
Prothrombin Time: 13 seconds (ref 11.4–15.2)

## 2017-03-11 LAB — TROPONIN I: Troponin I: <0.03 ng/mL (ref <0.03)

## 2017-03-11 LAB — APTT: APTT: 31 s (ref 24–36)

## 2017-03-11 LAB — SEDIMENTATION RATE: SED RATE: 6 mm/h (ref 0–22)

## 2017-03-11 MED ORDER — ENOXAPARIN SODIUM 40 MG/0.4ML ~~LOC~~ SOLN
40.0000 mg | SUBCUTANEOUS | Status: DC
  Administered 2017-03-11: 40 mg via SUBCUTANEOUS
  Filled 2017-03-11: qty 0.4

## 2017-03-11 MED ORDER — SODIUM CHLORIDE 0.9% FLUSH
3.0000 mL | Freq: Two times a day (BID) | INTRAVENOUS | Status: DC
  Administered 2017-03-11: 3 mL via INTRAVENOUS

## 2017-03-11 MED ORDER — ACETAMINOPHEN 650 MG RE SUPP: 650.0000 mg | Freq: Four times a day (QID) | RECTAL | Status: DC | PRN

## 2017-03-11 MED ORDER — PANTOPRAZOLE SODIUM 40 MG PO TBEC
40.0000 mg | DELAYED_RELEASE_TABLET | Freq: Every day | ORAL | Status: DC
  Administered 2017-03-11 – 2017-03-12 (×2): 40 mg via ORAL
  Filled 2017-03-11 (×2): qty 1

## 2017-03-11 MED ORDER — ACETAMINOPHEN 325 MG PO TABS: 650.0000 mg | ORAL_TABLET | Freq: Four times a day (QID) | ORAL | Status: DC | PRN

## 2017-03-11 MED ORDER — ONDANSETRON HCL 4 MG PO TABS: 4.0000 mg | ORAL_TABLET | Freq: Four times a day (QID) | ORAL | Status: DC | PRN

## 2017-03-11 MED ORDER — METOPROLOL TARTRATE 50 MG PO TABS
50.0000 mg | ORAL_TABLET | Freq: Two times a day (BID) | ORAL | Status: DC
  Administered 2017-03-11 – 2017-03-12 (×2): 50 mg via ORAL
  Filled 2017-03-11 (×2): qty 1

## 2017-03-11 MED ORDER — AMLODIPINE BESYLATE 5 MG PO TABS
5.0000 mg | ORAL_TABLET | Freq: Every day | ORAL | Status: DC
  Administered 2017-03-12: 5 mg via ORAL
  Filled 2017-03-11: qty 1

## 2017-03-11 MED ORDER — AMOXICILLIN-POT CLAVULANATE 875-125 MG PO TABS
1.0000 | ORAL_TABLET | Freq: Two times a day (BID) | ORAL | Status: DC
  Administered 2017-03-11 – 2017-03-12 (×2): 1 via ORAL
  Filled 2017-03-11 (×2): qty 1

## 2017-03-11 MED ORDER — ONDANSETRON HCL 4 MG/2ML IJ SOLN: 4.0000 mg | Freq: Four times a day (QID) | INTRAMUSCULAR | Status: DC | PRN

## 2017-03-11 NOTE — ED Triage Notes (Signed)
Per EMS patient was cleaning her house for family coming in and had worst headache ever, impending doom and near syncopal episode. Patient remembered she hadnt taken BP meds this morning so she double dose of metoprolol for a total of 100mg  of metoprolol today and 263mg  ASA at home. Patient called EMS- upon EMS arrival patient noted right jaw burning pain, facial flushing, feeling bad- no headache on EMS arrival- no neuro deficits noted. Patient was holding ice pack to right jaw. Upon arrival to ED patient started to feel better, when getting registered patient stated the feeling was coming back. At present patient state "I feel okay, not great but okay"

## 2017-03-11 NOTE — ED Notes (Signed)
Attempted report 

## 2017-03-11 NOTE — ED Provider Notes (Signed)
[facility] DEPT Provider Note   CSN: [id number] Arrival date & time: 03/11/17  1216     History   Chief Complaint Chief Complaint  Patient presents with  . Headache    HPI Mary Riley is a 71 y.o. female.  HPI  Pt comes in with cc of headaches. Pt has hx of AF, she is not taking the NOAC. PT reports that she started having sudden severe headaches. Pt felt that maybe the headaches were due to her BP, so she checked the BP took 2 metoprolol, as the BP was high, and called EMS. En route pt started feeling better, but as she was being registered she started feeling like she might black out again. Pt reports palpitations during this episode. She had no chest pain, dib. Pt denies any neurologic symptoms like numbness, tingling, weakness. Pt has no hx of PE, DVT and denies any exogenous hormone (testosterone / estrogen) use, long distance travels or surgery in the past 6 weeks, active cancer, recent immobilization.    Past Medical History:  Diagnosis Date  . Abdominal pain Nov. 2014  . Allergy   . Aneurysm of splenic artery [facility]) Nov 2014   Ref by Dr. Arta Silence  . Benign hematuria   . GERD (gastroesophageal reflux disease)   . History of Crohn's disease   . Hypertension     Patient Active Problem List   Diagnosis Date Noted  . Atrial fibrillation with RVR ([place]) 02/02/2016  . Splenic artery aneurysm ([place]) 12/01/2013    Past Surgical History:  Procedure Laterality Date  . ABDOMINAL HYSTERECTOMY    . COLONOSCOPY  2006 and 2011   Dr. Sammuel Cooper  . FOOT SURGERY Left 2012  . KNEE SURGERY Right Aug. 2006    OB History    No data available       Home Medications    Prior to Admission medications   Medication Sig Start Date End Date Taking? Authorizing Provider  amLODipine (NORVASC) 5 MG tablet Take 1 tablet (5 mg total) by mouth daily. 02/08/17   Josue Hector, MD  cholecalciferol (VITAMIN D) 1000 units tablet Take 1,000 Units by mouth daily.    Historical  Provider, MD  Coenzyme Q10 (COQ10 PO) Take 1 tablet by mouth daily.    Historical Provider, MD  LIVALO 1 MG TABS Take 1 mg by mouth 3 (three) times a week.  01/24/16   Historical Provider, MD  metoprolol (LOPRESSOR) 50 MG tablet Take 1 tablet (50 mg total) by mouth 2 (two) times daily. 02/08/17   Josue Hector, MD  omeprazole (PRILOSEC) 40 MG capsule Take 40 mg by mouth daily.    Historical Provider, MD  rivaroxaban (XARELTO) 20 MG TABS tablet Take 1 tablet (20 mg total) by mouth daily with supper. 02/08/17   Josue Hector, MD    Family History Family History  Problem Relation Age of Onset  . Heart disease Mother 34    Heart Disease before age 59- Brain Aneurysm  . Hypertension Mother   . Aneurysm Mother     brain  . Heart disease Sister     Brain Aneurysm  . Aneurysm Sister     brain   . Cancer Maternal Grandmother     Colon  . Cancer Brother     Social History Social History  Substance Use Topics  . Smoking status: Never Smoker  . Smokeless tobacco: Never Used  . Alcohol use 0.6 oz/week    1 Glasses of wine per  week     Allergies   Dilaudid [hydromorphone hcl]; Ace inhibitors; Codeine; Crestor [rosuvastatin]; Demerol [meperidine]; Lipitor [atorvastatin]; Zithromax [azithromycin]; Biaxin [clarithromycin]; and Sulfa antibiotics   Review of Systems Review of Systems   ROS 10 Systems reviewed and are negative for acute change except as noted in the HPI.     Physical Exam Updated Vital Signs BP (!) 143/72   Pulse 68   Temp 98 F (36.7 C) (Oral)   Resp 19   Ht 5' 4.5" (1.638 m)   Wt 164 lb (74.4 kg)   SpO2 97%   BMI 27.72 kg/m   Physical Exam  Constitutional: She is oriented to person, place, and time. She appears well-developed and well-nourished.  HENT:  Head: Normocephalic and atraumatic.  Eyes: EOM are normal. Pupils are equal, round, and reactive to light.  Neck: Neck supple.  Cardiovascular: Normal rate, regular rhythm and normal heart sounds.   No  murmur heard. Pulmonary/Chest: Effort normal. No respiratory distress.  Abdominal: Soft. She exhibits no distension. There is no tenderness. There is no rebound and no guarding.  Neurological: She is alert and oriented to person, place, and time. No cranial nerve deficit. Coordination normal.  Skin: Skin is warm and dry.  Nursing note and vitals reviewed.    ED Treatments / Results  Labs (all labs ordered are listed, but only abnormal results are displayed) Labs Reviewed  BASIC METABOLIC PANEL - Abnormal; Notable for the following:       Result Value   Glucose, Bld 115 (*)    Creatinine, Ser 1.12 (*)    GFR calc non Af Amer 48 (*)    GFR calc Af Amer 56 (*)    All other components within normal limits  URINALYSIS, ROUTINE W REFLEX MICROSCOPIC - Abnormal; Notable for the following:    Color, Urine STRAW (*)    All other components within normal limits  CBC  PROTIME-INR  APTT  SEDIMENTATION RATE  TROPONIN I    EKG  EKG Interpretation  Date/Time:  Monday March 11 2017 12:23:05 EDT Ventricular Rate:  72 PR Interval:    QRS Duration: 100 QT Interval:  395 QTC Calculation: 433 R Axis:   58 Text Interpretation:  Sinus rhythm RSR' in V1 or V2, right VCD or RVH Borderline T abnormalities, anterior leads No acute changes No significant change since last tracing Confirmed by Aldonia Keeven, MD, Thelma Comp [phone]) on 03/11/2017 2:21:15 PM       Radiology Ct Head Wo Contrast  Result Date: 03/11/2017 CLINICAL DATA:  Headache and dizziness EXAM: CT HEAD WITHOUT CONTRAST TECHNIQUE: Contiguous axial images were obtained from the base of the skull through the vertex without intravenous contrast. COMPARISON:  None. FINDINGS: Brain: The ventricles are normal in size and configuration. There is no intracranial mass, hemorrhage, extra-axial fluid collection, or midline shift. Gray-white compartments are normal. No evident acute infarct. Vascular: No hyperdense vessel is appreciable. There is no  appreciable vascular calcification. Skull: Bony calvarium appears intact. Sinuses/Orbits: There is slight mucosal thickening in several ethmoid air cells. Other visualized paranasal sinuses clear. Orbits appear symmetric bilaterally. Other: Mastoid air cells are clear. There is soft tissue asymmetry immediately inferior to the left external auditory canal compared to the right side. Significance of this finding is uncertain. IMPRESSION: Slight ethmoid sinus disease bilaterally. No intracranial mass, hemorrhage, or extra-axial fluid collection. Gray-white compartments appear normal. Soft tissue prominence immediately inferior to the left external auditory canal/external ear compared to the right side. Significance of this  finding uncertain. Clinical assessment of this area may be indicated. Electronically Signed   By: Lowella Grip III M.D.   On: 03/11/2017 15:30    Procedures Procedures (including critical care time)  Medications Ordered in ED Medications - No data to display   Initial Impression / Assessment and Plan / ED Course  I have reviewed the triage vital signs and the nursing notes.  Pertinent labs & imaging results that were available during my care of the patient were reviewed by me and considered in my medical decision making (see chart for details).      Pt comes in with cc of palpitations, neat syncope, headache. She also had jaw discomfort. No focal neuro complains. No focal neuro deficits. No chest pain / dib. Pt has no palpitations here, and her cardiac exam is normal.  Concerns for pt possibly in AF with RVR and symptoms resulting due to that. We will admit for syncope, especially since pt is very apprehensive about going home, since she felt like she was going to die.  EKG shows no STEMI. Trops ordered. Sed rate ordered due to jaw pain - but pretest probability for temporal arteritis is very low - as the headache resolved spontaneously.  Ct head ordered due to pt c/o  severe headaches. With neg CT head, SAH is essentially ruled out with 99 % sensitivity as the CT was completed within 6 hours of the headache onset.   Final Clinical Impressions(s) / ED Diagnoses   Final diagnoses:  Near syncope  Palpitations    New Prescriptions New Prescriptions   No medications on file     Varney Biles, MD 03/11/17 1635

## 2017-03-11 NOTE — H&P (Addendum)
History and Physical    Mary Riley YIR:[id number] DOB: Feb 08, 1946 DOA: 03/11/2017  Referring MD/NP/PA:  PCP: Vidal Schwalbe, MD Outpatient Specialists: Johnsie Cancel Patient coming from: home  Chief Complaint: headache/palpitations  HPI: Mary Riley is a 71 y.o. female with medical history significant of HTN, paroxysmal a fib, and aneurysm of the splenic artery.  Patient was in good health until this AM.  She forgot to take her BP medications this AM.  At 10 AM, an hour after patient normally takes her BP meds, she was vacuuming when she developed a sudden headache and palpitations.  She tried to take her medications but was very clumsy.  EMS was called and her BP was 190/110 per patient.  En route to the hospital, patient began to feel better.  Episode was witnessed by a great-granddaughter and patient did NOT have an episode of syncope. She is not compliant with blood thinning medications-- is afraid to take xarelto.  She tells her cardiologist that she is taking but is not as there are too many commercials regarding this medication.   She also feels that she is getting a sinus infection.   Patient had an episode of hypertensive urgency 2 years ago while at the beach.    In the ER, her troponin was negative.  Her EKG was similar to previous and other labs unremarkable.  CT scan negative   Review of Systems: all systems reviewed, negative unless stated above in HPI   Past Medical History:  Diagnosis Date  . Abdominal pain Nov. 2014  . Allergy   . Aneurysm of splenic artery [facility]) Nov 2014   Ref by Dr. Arta Silence  . Benign hematuria   . GERD (gastroesophageal reflux disease)   . History of Crohn's disease   . Hypertension     Past Surgical History:  Procedure Laterality Date  . ABDOMINAL HYSTERECTOMY    . COLONOSCOPY  2006 and 2011   Dr. Sammuel Cooper  . FOOT SURGERY Left 2012  . KNEE SURGERY Right Aug. 2006     reports that she has never smoked. She has never used smokeless  tobacco. She reports that she drinks about 0.6 oz of alcohol per week . She reports that she does not use drugs.  Allergies  Allergen Reactions  . Dilaudid [Hydromorphone Hcl] Nausea And Vomiting  . Ace Inhibitors Cough  . Codeine Nausea Only  . Crestor [Rosuvastatin] Other (See Comments)    Joints hurt  . Demerol [Meperidine] Nausea Only  . Lipitor [Atorvastatin] Other (See Comments)    Joints hurt  . Zithromax [Azithromycin]     unknown  . Biaxin [Clarithromycin] Rash  . Sulfa Antibiotics Rash    Family History  Problem Relation Age of Onset  . Heart disease Mother 10    Heart Disease before age 46- Brain Aneurysm  . Hypertension Mother   . Aneurysm Mother     brain  . Heart disease Sister     Brain Aneurysm  . Aneurysm Sister     brain   . Cancer Maternal Grandmother     Colon  . Cancer Brother     Prior to Admission medications   Medication Sig Start Date End Date Taking? Authorizing Provider  amLODipine (NORVASC) 5 MG tablet Take 1 tablet (5 mg total) by mouth daily. 02/08/17   Josue Hector, MD  cholecalciferol (VITAMIN D) 1000 units tablet Take 1,000 Units by mouth daily.    Historical Provider, MD  Coenzyme Q10 (COQ10 PO) Take 1 tablet  by mouth daily.    Historical Provider, MD  LIVALO 1 MG TABS Take 1 mg by mouth 3 (three) times a week.  01/24/16   Historical Provider, MD  metoprolol (LOPRESSOR) 50 MG tablet Take 1 tablet (50 mg total) by mouth 2 (two) times daily. 02/08/17   Josue Hector, MD  omeprazole (PRILOSEC) 40 MG capsule Take 40 mg by mouth daily.    Historical Provider, MD  rivaroxaban (XARELTO) 20 MG TABS tablet Take 1 tablet (20 mg total) by mouth daily with supper. 02/08/17   Josue Hector, MD    Physical Exam: Vitals:   03/11/17 1415 03/11/17 1500 03/11/17 1530 03/11/17 1545  BP: (!) 142/62 138/74 (!) 149/73 (!) 143/72  Pulse: 65 64 67 68  Resp: 15 16 13 19   Temp:      TempSrc:      SpO2: 100% 98% 99% 97%  Weight:      Height:           Constitutional: Nervous appearing Vitals:   03/11/17 1415 03/11/17 1500 03/11/17 1530 03/11/17 1545  BP: (!) 142/62 138/74 (!) 149/73 (!) 143/72  Pulse: 65 64 67 68  Resp: 15 16 13 19   Temp:      TempSrc:      SpO2: 100% 98% 99% 97%  Weight:      Height:       Eyes: PERRL, lids and conjunctivae normal ENMT: Mucous membranes are moist. Posterior pharynx clear of any exudate or lesions.Normal dentition.  Neck: supple Respiratory: clear to auscultation bilaterally, no wheezing, no crackles. Normal respiratory effort. No accessory muscle use.  Cardiovascular: Regular rate and rhythm, no murmurs / rubs / gallops. No extremity edema. 2+ pedal pulses. No carotid bruits.  Abdomen: no tenderness, no masses palpated. No hepatosplenomegaly. Bowel sounds positive.  Musculoskeletal: no clubbing / cyanosis. No joint deformity upper and lower extremities. Good ROM, no contractures. Normal muscle tone.  Skin: on face flushed over eyes and on nasolabial folds Neurologic: CN 2-12 grossly intact. Sensation intact, DTR normal. Strength 5/5 in all 4.  Psychiatric: Normal judgment and insight. Alert and oriented x 3. Normal mood.     Labs on Admission: I have personally reviewed following labs and imaging studies  CBC:  Recent Labs Lab 03/11/17 1237  WBC 6.7  HGB 12.1  HCT 37.4  MCV 93.7  PLT 237   Basic Metabolic Panel:  Recent Labs Lab 03/11/17 1237  NA 139  K 3.8  CL 105  CO2 24  GLUCOSE 115*  BUN 20  CREATININE 1.12*  CALCIUM 9.4   GFR: Estimated Creatinine Clearance: 46 mL/min (A) (by C-G formula based on SCr of 1.12 mg/dL (H)). Liver Function Tests: No results for input(s): AST, ALT, ALKPHOS, BILITOT, PROT, ALBUMIN in the last 168 hours. No results for input(s): LIPASE, AMYLASE in the last 168 hours. No results for input(s): AMMONIA in the last 168 hours. Coagulation Profile:  Recent Labs Lab 03/11/17 1532  INR 0.98   Cardiac Enzymes:  Recent Labs Lab  03/11/17 1532  TROPONINI <0.03   BNP (last 3 results) No results for input(s): PROBNP in the last 8760 hours. HbA1C: No results for input(s): HGBA1C in the last 72 hours. CBG: No results for input(s): GLUCAP in the last 168 hours. Lipid Profile: No results for input(s): CHOL, HDL, LDLCALC, TRIG, CHOLHDL, LDLDIRECT in the last 72 hours. Thyroid Function Tests: No results for input(s): TSH, T4TOTAL, FREET4, T3FREE, THYROIDAB in the last 72 hours. Anemia Panel:  No results for input(s): VITAMINB12, FOLATE, FERRITIN, TIBC, IRON, RETICCTPCT in the last 72 hours. Urine analysis:    Component Value Date/Time   COLORURINE STRAW (A) 03/11/2017 [place] 03/11/2017 1549   LABSPEC 1.009 03/11/2017 1549   PHURINE 6.0 03/11/2017 1549   GLUCOSEU NEGATIVE 03/11/2017 1549   HGBUR NEGATIVE 03/11/2017 1549   BILIRUBINUR NEGATIVE 03/11/2017 1549   KETONESUR NEGATIVE 03/11/2017 1549   PROTEINUR NEGATIVE 03/11/2017 1549   NITRITE NEGATIVE 03/11/2017 1549   LEUKOCYTESUR NEGATIVE 03/11/2017 1549   Sepsis Labs: Invalid input(s): PROCALCITONIN, LACTICIDVEN No results found for this or any previous visit (from the past 240 hour(s)).   Radiological Exams on Admission: Ct Head Wo Contrast  Result Date: 03/11/2017 CLINICAL DATA:  Headache and dizziness EXAM: CT HEAD WITHOUT CONTRAST TECHNIQUE: Contiguous axial images were obtained from the base of the skull through the vertex without intravenous contrast. COMPARISON:  None. FINDINGS: Brain: The ventricles are normal in size and configuration. There is no intracranial mass, hemorrhage, extra-axial fluid collection, or midline shift. Gray-white compartments are normal. No evident acute infarct. Vascular: No hyperdense vessel is appreciable. There is no appreciable vascular calcification. Skull: Bony calvarium appears intact. Sinuses/Orbits: There is slight mucosal thickening in several ethmoid air cells. Other visualized paranasal sinuses  clear. Orbits appear symmetric bilaterally. Other: Mastoid air cells are clear. There is soft tissue asymmetry immediately inferior to the left external auditory canal compared to the right side. Significance of this finding is uncertain. IMPRESSION: Slight ethmoid sinus disease bilaterally. No intracranial mass, hemorrhage, or extra-axial fluid collection. Gray-white compartments appear normal. Soft tissue prominence immediately inferior to the left external auditory canal/external ear compared to the right side. Significance of this finding uncertain. Clinical assessment of this area may be indicated. Electronically Signed   By: Lowella Grip III M.D.   On: 03/11/2017 15:30    EKG: Independently reviewed. sinus  Assessment/Plan Active Problems:   Palpitations   AF (paroxysmal atrial fibrillation) (HCC)   HTN (hypertension), malignant  HTN urgency -BP improved and symptoms resolved in ER -?if related to missing dose of BP medication that AM -doubt pheo or RA HTN -denies OTC sinus infection medications -troponin negative  PAF -Chad-vasc 2 is 3 -patient refusing NOAC -discussed coumadin-- patient will speak with cardiologist  Palpitations -watch on tele -check echo  Sinus infection -augmentin -CT scan shows ethmoid sinus disease  Headache -resolved- suspect related to high BP -no neurological signs --sed rate pending per ER doc-- doubt temporal arteritis   DVT prophylaxis: lovenox Code Status: full Family Communication: multiple members at bedside Disposition Plan: tele obs    [facility] Pager [phone]  If 7PM-7AM, please contact night-coverage [email or web address] Password [email or web address]  03/11/2017, 5:00 PM

## 2017-03-12 ENCOUNTER — Observation Stay (HOSPITAL_BASED_OUTPATIENT_CLINIC_OR_DEPARTMENT_OTHER): Payer: Medicare Other

## 2017-03-12 ENCOUNTER — Other Ambulatory Visit: Payer: Self-pay | Admitting: Physician Assistant

## 2017-03-12 ENCOUNTER — Encounter (HOSPITAL_COMMUNITY): Payer: Self-pay | Admitting: General Practice

## 2017-03-12 DIAGNOSIS — R0602 Shortness of breath: Secondary | ICD-10-CM

## 2017-03-12 DIAGNOSIS — R55 Syncope and collapse: Secondary | ICD-10-CM

## 2017-03-12 DIAGNOSIS — I10 Essential (primary) hypertension: Secondary | ICD-10-CM | POA: Diagnosis not present

## 2017-03-12 DIAGNOSIS — R002 Palpitations: Secondary | ICD-10-CM | POA: Diagnosis not present

## 2017-03-12 LAB — CBC
HCT: 38.1 % (ref 36.0–46.0)
Hemoglobin: 12.3 g/dL (ref 12.0–15.0)
MCH: 30.3 pg (ref 26.0–34.0)
MCHC: 32.3 g/dL (ref 30.0–36.0)
MCV: 93.8 fL (ref 78.0–100.0)
PLATELETS: 252 10*3/uL (ref 150–400)
RBC: 4.06 MIL/uL (ref 3.87–5.11)
RDW: 13.1 % (ref 11.5–15.5)
WBC: 8.3 10*3/uL (ref 4.0–10.5)

## 2017-03-12 LAB — BASIC METABOLIC PANEL
Anion gap: 10 (ref 5–15)
BUN: 16 mg/dL (ref 6–20)
CALCIUM: 9.4 mg/dL (ref 8.9–10.3)
CO2: 25 mmol/L (ref 22–32)
CREATININE: 0.99 mg/dL (ref 0.44–1.00)
Chloride: 105 mmol/L (ref 101–111)
GFR calc Af Amer: >60 mL/min (ref >60)
GFR calc non Af Amer: 56 mL/min — ABNORMAL LOW (ref >60)
Glucose, Bld: 111 mg/dL — ABNORMAL HIGH (ref 65–99)
Potassium: 3.4 mmol/L — ABNORMAL LOW (ref 3.5–5.1)
SODIUM: 140 mmol/L (ref 135–145)

## 2017-03-12 LAB — MAGNESIUM: MAGNESIUM: 1.9 mg/dL (ref 1.7–2.4)

## 2017-03-12 LAB — ECHOCARDIOGRAM COMPLETE
Height: 64.5 in
WEIGHTICAEL: 2624 [oz_av]

## 2017-03-12 MED ORDER — POTASSIUM CHLORIDE CRYS ER 20 MEQ PO TBCR
40.0000 meq | EXTENDED_RELEASE_TABLET | Freq: Once | ORAL | Status: AC
  Administered 2017-03-12: 40 meq via ORAL
  Filled 2017-03-12: qty 2

## 2017-03-12 MED ORDER — AMOXICILLIN-POT CLAVULANATE 875-125 MG PO TABS: 1.0000 | ORAL_TABLET | Freq: Two times a day (BID) | ORAL | 0 refills | Status: DC

## 2017-03-12 NOTE — Progress Notes (Signed)
  Echocardiogram 2D Echocardiogram has been performed.  Jennette Dubin 03/12/2017, 8:55 AM

## 2017-03-12 NOTE — Discharge Summary (Signed)
Physician Discharge Summary  Mary Riley JAS:[id number] DOB: Nov 18, 1946 DOA: 03/11/2017  PCP: Vidal Schwalbe, MD  Admit date: 03/11/2017 Discharge date: 03/12/2017   Recommendations for Outpatient Follow-Up:   1. Outpatient 30 day event monitor 2. Patient instructed to take medications as prescribed 3. augmentin for sinus infection   Discharge Diagnosis:   Active Problems:   Palpitations   AF (paroxysmal atrial fibrillation) (HCC)   HTN (hypertension), malignant   Discharge disposition:  Home  Discharge Condition: Improved.  Diet recommendation: Low sodium, heart healthy  Wound care: None.   History of Present Illness:   Mary Riley is a 71 y.o. female with medical history significant of HTN, paroxysmal a fib, and aneurysm of the splenic artery.  Patient was in good health until this AM.  She forgot to take her BP medications this AM.  At 10 AM, an hour after patient normally takes her BP meds, she was vacuuming when she developed a sudden headache and palpitations.  She tried to take her medications but was very clumsy.  EMS was called and her BP was 190/110 per patient.  En route to the hospital, patient began to feel better.  Episode was witnessed by a great-granddaughter and patient did NOT have an episode of syncope. She is not compliant with blood thinning medications-- is afraid to take xarelto.  She tells her cardiologist that she is taking but is not as there are too many commercials regarding this medication.   She also feels that she is getting a sinus infection.   Patient had an episode of hypertensive urgency 2 years ago while [place] the beach.    In the ER, her troponin was negative.  Her EKG was similar to previous and other labs unremarkable.  CT scan negative   Hospital Course by Problem:   HTN urgency -BP improved and symptoms resolved in ER -supect related to missing dose of BP medication - BP in hospital controlled -doubt pheo or RA HTN -denies OTC  sinus infection medications -troponin negative  PAF -Chad-vasc 2 is 3 -patient was refusing NOAC -upon d/c, patient agreeable to take medication at home  Palpitations -NSR -30 day event monitor-- during last hospitalization, this was on d/c summary but never done  Sinus infection -augmentin -CT scan shows ethmoid sinus disease  Headache -resolved- suspect related to high BP -no neurological signs --sed rate negative    Medical Consultants:    None.   Discharge Exam:   Vitals:   03/12/17 0345 03/12/17 0942  BP: (!) 117/51 138/70  Pulse: 72 89  Resp: 16   Temp: 98.1 F (36.7 C)    Vitals:   03/11/17 1842 03/11/17 2104 03/12/17 0345 03/12/17 0942  BP: (!) 142/97 135/67 (!) 117/51 138/70  Pulse:  72 72 89  Resp: 16 16 16    Temp: 98.6 F (37 C) 98.5 F (36.9 C) 98.1 F (36.7 C)   TempSrc: Oral Oral Oral   SpO2: 100% 97% 97%   Weight:      Height:        Gen:  NAD    The results of significant diagnostics from this hospitalization (including imaging, microbiology, ancillary and laboratory) are listed below for reference.     Procedures and Diagnostic Studies:   Ct Head Wo Contrast  Result Date: 03/11/2017 CLINICAL DATA:  Headache and dizziness EXAM: CT HEAD WITHOUT CONTRAST TECHNIQUE: Contiguous axial images were obtained from the base of the skull through the vertex without intravenous contrast. COMPARISON:  None.  FINDINGS: Brain: The ventricles are normal in size and configuration. There is no intracranial mass, hemorrhage, extra-axial fluid collection, or midline shift. Gray-white compartments are normal. No evident acute infarct. Vascular: No hyperdense vessel is appreciable. There is no appreciable vascular calcification. Skull: Bony calvarium appears intact. Sinuses/Orbits: There is slight mucosal thickening in several ethmoid air cells. Other visualized paranasal sinuses clear. Orbits appear symmetric bilaterally. Other: Mastoid air cells are  clear. There is soft tissue asymmetry immediately inferior to the left external auditory canal compared to the right side. Significance of this finding is uncertain. IMPRESSION: Slight ethmoid sinus disease bilaterally. No intracranial mass, hemorrhage, or extra-axial fluid collection. Gray-white compartments appear normal. Soft tissue prominence immediately inferior to the left external auditory canal/external ear compared to the right side. Significance of this finding uncertain. Clinical assessment of this area may be indicated. Electronically Signed   By: Lowella Grip III M.D.   On: 03/11/2017 15:30     Labs:   Basic Metabolic Panel:  Recent Labs Lab 03/11/17 1237 03/12/17 0427 03/12/17 0805  NA 139 140  --   K 3.8 3.4*  --   CL 105 105  --   CO2 24 25  --   GLUCOSE 115* 111*  --   BUN 20 16  --   CREATININE 1.12* 0.99  --   CALCIUM 9.4 9.4  --   MG  --   --  1.9   GFR Estimated Creatinine Clearance: 52.1 mL/min (by C-G formula based on SCr of 0.99 mg/dL). Liver Function Tests: No results for input(s): AST, ALT, ALKPHOS, BILITOT, PROT, ALBUMIN in the last 168 hours. No results for input(s): LIPASE, AMYLASE in the last 168 hours. No results for input(s): AMMONIA in the last 168 hours. Coagulation profile  Recent Labs Lab 03/11/17 1532  INR 0.98    CBC:  Recent Labs Lab 03/11/17 1237 03/12/17 0427  WBC 6.7 8.3  HGB 12.1 12.3  HCT 37.4 38.1  MCV 93.7 93.8  PLT 256 252   Cardiac Enzymes:  Recent Labs Lab 03/11/17 1532  TROPONINI <0.03   BNP: Invalid input(s): POCBNP CBG: No results for input(s): GLUCAP in the last 168 hours. D-Dimer No results for input(s): DDIMER in the last 72 hours. Hgb A1c No results for input(s): HGBA1C in the last 72 hours. Lipid Profile No results for input(s): CHOL, HDL, LDLCALC, TRIG, CHOLHDL, LDLDIRECT in the last 72 hours. Thyroid function studies No results for input(s): TSH, T4TOTAL, T3FREE, THYROIDAB in the last 72  hours.  Invalid input(s): FREET3 Anemia work up No results for input(s): VITAMINB12, FOLATE, FERRITIN, TIBC, IRON, RETICCTPCT in the last 72 hours. Microbiology No results found for this or any previous visit (from the past 240 hour(s)).   Discharge Instructions:   Discharge Instructions    Diet - low sodium heart healthy    Complete by:  As directed    Discharge instructions    Complete by:  As directed    Be sure to take medications as prescribed   Increase activity slowly    Complete by:  As directed      Allergies as of 03/12/2017      Reactions   Dilaudid [hydromorphone Hcl] Nausea And Vomiting   Ace Inhibitors Cough   Codeine Nausea Only   Crestor [rosuvastatin] Other (See Comments)   Joints hurt   Demerol [meperidine] Nausea Only   Lipitor [atorvastatin] Other (See Comments)   Joints hurt   Zithromax [azithromycin]    unknown  Biaxin [clarithromycin] Rash   Sulfa Antibiotics Rash      Medication List    TAKE these medications   amLODipine 5 MG tablet Commonly known as:  NORVASC Take 1 tablet (5 mg total) by mouth daily.   amoxicillin-clavulanate 875-125 MG tablet Commonly known as:  AUGMENTIN Take 1 tablet by mouth every 12 (twelve) hours.   cholecalciferol 1000 units tablet Commonly known as:  VITAMIN D Take 6,000 Units by mouth daily.   COQ10 PO Take 1 tablet by mouth daily.   LIVALO 1 MG Tabs Generic drug:  Pitavastatin Calcium Take 1 mg by mouth 3 (three) times a week.   metoprolol 50 MG tablet Commonly known as:  LOPRESSOR Take 1 tablet (50 mg total) by mouth 2 (two) times daily.   omeprazole 40 MG capsule Commonly known as:  PRILOSEC Take 40 mg by mouth daily.   rivaroxaban 20 MG Tabs tablet Commonly known as:  XARELTO Take 1 tablet (20 mg total) by mouth daily with supper.      Follow-up Information    WHITE,CYNTHIA S, MD Follow up in 1 week(s).   Specialty:  Family Medicine Contact information: [address] [place] [address] [phone]        Jenkins Rouge, MD Follow up in 2 week(s).   Specialty:  Cardiology Why:  outpatient event monitor Contact information: [address] [place] [address] [phone]            Time coordinating discharge: 25 min  Signed:  Geradine Girt   [facility] 03/12/2017, 12:35 PM

## 2017-03-20 ENCOUNTER — Other Ambulatory Visit: Payer: Self-pay | Admitting: Radiology

## 2017-03-25 ENCOUNTER — Ambulatory Visit (INDEPENDENT_AMBULATORY_CARE_PROVIDER_SITE_OTHER): Payer: Medicare Other

## 2017-03-25 ENCOUNTER — Other Ambulatory Visit: Payer: Self-pay | Admitting: Physician Assistant

## 2017-03-25 DIAGNOSIS — R55 Syncope and collapse: Secondary | ICD-10-CM | POA: Diagnosis not present

## 2017-03-25 DIAGNOSIS — I480 Paroxysmal atrial fibrillation: Secondary | ICD-10-CM

## 2017-03-25 DIAGNOSIS — R002 Palpitations: Secondary | ICD-10-CM | POA: Diagnosis not present

## 2017-05-29 ENCOUNTER — Encounter: Payer: Self-pay | Admitting: Vascular Surgery

## 2017-06-11 ENCOUNTER — Ambulatory Visit (INDEPENDENT_AMBULATORY_CARE_PROVIDER_SITE_OTHER): Payer: Medicare Other | Admitting: Vascular Surgery

## 2017-06-11 ENCOUNTER — Encounter: Payer: Self-pay | Admitting: Vascular Surgery

## 2017-06-11 VITALS — BP 125/72 | HR 60 | Temp 98.1°F | Resp 16 | Ht 64.5 in | Wt 166.1 lb

## 2017-06-11 DIAGNOSIS — I728 Aneurysm of other specified arteries: Secondary | ICD-10-CM

## 2017-06-11 NOTE — Progress Notes (Signed)
[facility]ist of [facility]  Patient name: Mary Riley MRN: [id number] DOB: 1946/07/09 Sex: female  REASON FOR CONSULT: Follow-up incidental finding of splenic artery aneurysm  HPI: Mary Riley is a 71 y.o. female, who is here today for follow-up. Had incidental finding of the splenic artery aneurysm on CT scan on December 2014. I saw her at that time and recommended to follow-up in one year. This did not happen but she did have a CT scan follow-up in February 2017. She's had no other imaging studies since that exam. She is here for discussion. She has no symptoms referable to his aneurysm. She has remained stable from a standpoint of her Crohn's disease. No cardiac disease and no history of peripheral vascular occlusive disease  Past Medical History:  Diagnosis Date  . Abdominal pain Nov. 2014  . Allergy   . Aneurysm of splenic artery [facility]) Nov 2014   Ref by Dr. Arta Silence  . Benign hematuria   . GERD (gastroesophageal reflux disease)   . History of Crohn's disease   . Hypertension     Family History  Problem Relation Age of Onset  . Heart disease Mother 47       Heart Disease before age 2- Brain Aneurysm  . Hypertension Mother   . Aneurysm Mother        brain  . Heart disease Sister        Brain Aneurysm  . Aneurysm Sister        brain   . Cancer Maternal Grandmother        Colon  . Cancer Brother     SOCIAL HISTORY: Social History   Social History  . Marital status: Married    Spouse name: N/A  . Number of children: N/A  . Years of education: N/A   Occupational History  . Not on file.   Social History Main Topics  . Smoking status: Never Smoker  . Smokeless tobacco: Never Used  . Alcohol use 0.6 oz/week    1 Glasses of wine per week  . Drug use: No  . Sexual activity: Not on file   Other Topics Concern  . Not on file   Social History Narrative  . No narrative on file    Allergies  Allergen  Reactions  . Dilaudid [Hydromorphone Hcl] Nausea And Vomiting  . Ace Inhibitors Cough  . Codeine Nausea Only  . Crestor [Rosuvastatin] Other (See Comments)    Joints hurt  . Demerol [Meperidine] Nausea Only  . Lipitor [Atorvastatin] Other (See Comments)    Joints hurt  . Zithromax [Azithromycin]     unknown  . Biaxin [Clarithromycin] Rash  . Sulfa Antibiotics Rash    Current Outpatient Prescriptions  Medication Sig Dispense Refill  . amLODipine (NORVASC) 5 MG tablet Take 1 tablet (5 mg total) by mouth daily. 90 tablet 3  . cholecalciferol (VITAMIN D) 1000 units tablet Take 6,000 Units by mouth daily.     . Coenzyme Q10 (COQ10 PO) Take 1 tablet by mouth daily.    [name] LIVALO 1 MG TABS Take 1 mg by mouth 3 (three) times a week.   1  . metoprolol (LOPRESSOR) 50 MG tablet Take 1 tablet (50 mg total) by mouth 2 (two) times daily. 180 tablet 3  . omeprazole (PRILOSEC) 40 MG capsule Take 40 mg by mouth daily.    . rivaroxaban (XARELTO) 20 MG TABS tablet Take 1 tablet (20 mg total) by mouth daily with supper. [place]  tablet 3   No current facility-administered medications for this visit.     REVIEW OF SYSTEMS:  [X]  denotes positive finding, [ ]  denotes negative finding Cardiac  Comments:  Chest pain or chest pressure:    Shortness of breath upon exertion:    Short of breath when lying flat:    Irregular heart rhythm:        Vascular    Pain in calf, thigh, or hip brought on by ambulation:    Pain in feet at night that wakes you up from your sleep:     Blood clot in your veins:    Leg swelling:         Pulmonary    Oxygen at home:    Productive cough:     Wheezing:         Neurologic    Sudden weakness in arms or legs:     Sudden numbness in arms or legs:     Sudden onset of difficulty speaking or slurred speech:    Temporary loss of vision in one eye:     Problems with dizziness:         Gastrointestinal    Blood in stool:     Vomited blood:         Genitourinary    Burning  when urinating:     Blood in urine:        Psychiatric    Major depression:         Hematologic    Bleeding problems:    Problems with blood clotting too easily:        Skin    Rashes or ulcers:        Constitutional    Fever or chills:      PHYSICAL EXAM: Vitals:   06/11/17 1050  BP: 125/72  Pulse: 60  Resp: 16  Temp: 98.1 F (36.7 C)  TempSrc: Oral  SpO2: 98%  Weight: 166 lb 1.6 oz (75.3 kg)  Height: 5' 4.5" (1.638 m)    GENERAL: The patient is a well-nourished female, in no acute distress. The vital signs are documented above. CARDIOVASCULAR: 2+ radial and 2+ dorsalis pedis pulses bilaterally. Carotid arteries without bruits PULMONARY: There is good air exchange  ABDOMEN: Soft and non-tender . No abdominal bruits MUSCULOSKELETAL: There are no major deformities or cyanosis. NEUROLOGIC: No focal weakness or paresthesias are detected. SKIN: There are no ulcers or rashes noted. PSYCHIATRIC: The patient has a normal affect.  DATA:  I did review her scan from February 2017. Her aneurysm of her splenic artery and would not change from her initial study to this study. There is mural thrombus involving a there are some smaller probable aneurysms in the distal branch vessels of the splenic artery as well  MEDICAL ISSUES: Again discussed the significance of this with patient. Expanded she has a very low lifetime risk of rupture of her splenic artery aneurysm. I did explain that her most recent study was over one year old. I do feel comfortable looking at this again in 2 years with CT scan and would recommend scan approximately every 3 years to prove that she is not having any change of her aneurysm. I did discuss symptoms of leaking aneurysm explain the importance for her to present immediately to the emergency room should this occur. She was reassured with this discussion will see [place] again in 2 years with CT scan of her abdomen and pelvis   Rosetta Posner, MD FACS  [facility]  [facility] Tel [phone] Pager [phone]

## 2017-07-04 ENCOUNTER — Telehealth: Payer: Self-pay | Admitting: Cardiovascular Disease

## 2017-07-04 NOTE — Telephone Encounter (Signed)
Called patient with monitor results.  Notes recorded by Josue Hector, MD on 04/25/2017 at 10:25 AM EDT NSR no arrhythmia

## 2017-07-04 NOTE — Telephone Encounter (Signed)
Mary Riley is calling to get the results from the monitor she wore back in April.Marland KitchenPlease call

## 2017-08-07 NOTE — Addendum Note (Signed)
Addended by: Lianne Cure A on: 08/07/2017 12:06 PM   Modules accepted: Orders

## 2018-01-21 ENCOUNTER — Encounter (HOSPITAL_COMMUNITY): Payer: Self-pay | Admitting: Pharmacy Technician

## 2018-01-21 ENCOUNTER — Other Ambulatory Visit: Payer: Self-pay

## 2018-01-21 ENCOUNTER — Emergency Department (HOSPITAL_COMMUNITY)
Admission: EM | Admit: 2018-01-21 | Discharge: 2018-01-21 | Disposition: A | Discharge status: Home / Self Care | Payer: Medicare Other | Attending: Physician Assistant | Admitting: Physician Assistant

## 2018-01-21 DIAGNOSIS — R55 Syncope and collapse: Secondary | ICD-10-CM | POA: Insufficient documentation

## 2018-01-21 DIAGNOSIS — Z79899 Other long term (current) drug therapy: Secondary | ICD-10-CM | POA: Insufficient documentation

## 2018-01-21 DIAGNOSIS — I10 Essential (primary) hypertension: Secondary | ICD-10-CM | POA: Insufficient documentation

## 2018-01-21 DIAGNOSIS — Z7901 Long term (current) use of anticoagulants: Secondary | ICD-10-CM | POA: Insufficient documentation

## 2018-01-21 LAB — COMPREHENSIVE METABOLIC PANEL
ALT: 30 U/L (ref 14–54)
AST: 40 U/L (ref 15–41)
Albumin: 4.2 g/dL (ref 3.5–5.0)
Alkaline Phosphatase: 69 U/L (ref 38–126)
Anion gap: 12 (ref 5–15)
BILIRUBIN TOTAL: 0.6 mg/dL (ref 0.3–1.2)
BUN: 14 mg/dL (ref 6–20)
CHLORIDE: 103 mmol/L (ref 101–111)
CO2: 23 mmol/L (ref 22–32)
CREATININE: 1.08 mg/dL — AB (ref 0.44–1.00)
Calcium: 9.9 mg/dL (ref 8.9–10.3)
GFR calc Af Amer: 58 mL/min — ABNORMAL LOW (ref >60)
GFR, EST NON AFRICAN AMERICAN: 50 mL/min — AB (ref >60)
Glucose, Bld: 102 mg/dL — ABNORMAL HIGH (ref 65–99)
Potassium: 4.8 mmol/L (ref 3.5–5.1)
Sodium: 138 mmol/L (ref 135–145)
TOTAL PROTEIN: 7.1 g/dL (ref 6.5–8.1)

## 2018-01-21 LAB — URINALYSIS, ROUTINE W REFLEX MICROSCOPIC
Bilirubin Urine: NEGATIVE
GLUCOSE, UA: NEGATIVE mg/dL
HGB URINE DIPSTICK: NEGATIVE
Ketones, ur: NEGATIVE mg/dL
Leukocytes, UA: NEGATIVE
Nitrite: NEGATIVE
PH: 7 (ref 5.0–8.0)
Protein, ur: NEGATIVE mg/dL
SPECIFIC GRAVITY, URINE: 1.004 — AB (ref 1.005–1.030)

## 2018-01-21 LAB — I-STAT TROPONIN, ED
TROPONIN I, POC: 0 ng/mL (ref 0.00–0.08)
Troponin i, poc: 0 ng/mL (ref 0.00–0.08)

## 2018-01-21 LAB — CBC WITH DIFFERENTIAL/PLATELET
Basophils Absolute: 0.1 10*3/uL (ref 0.0–0.1)
Basophils Relative: 1 %
EOS ABS: 0.1 10*3/uL (ref 0.0–0.7)
EOS PCT: 1 %
HCT: 40.8 % (ref 36.0–46.0)
Hemoglobin: 13.3 g/dL (ref 12.0–15.0)
LYMPHS ABS: 1.7 10*3/uL (ref 0.7–4.0)
Lymphocytes Relative: 15 %
MCH: 30.4 pg (ref 26.0–34.0)
MCHC: 32.6 g/dL (ref 30.0–36.0)
MCV: 93.4 fL (ref 78.0–100.0)
MONO ABS: 0.6 10*3/uL (ref 0.1–1.0)
Monocytes Relative: 6 %
Neutro Abs: 8.5 10*3/uL — ABNORMAL HIGH (ref 1.7–7.7)
Neutrophils Relative %: 77 %
PLATELETS: 270 10*3/uL (ref 150–400)
RBC: 4.37 MIL/uL (ref 3.87–5.11)
RDW: 12.9 % (ref 11.5–15.5)
WBC: 10.9 10*3/uL — AB (ref 4.0–10.5)

## 2018-01-21 LAB — TSH: TSH: 3.029 u[IU]/mL (ref 0.350–4.500)

## 2018-01-21 LAB — I-STAT CG4 LACTIC ACID, ED: Lactic Acid, Venous: 1.63 mmol/L (ref 0.5–1.9)

## 2018-01-21 MED ORDER — SODIUM CHLORIDE 0.9 % IV BOLUS (SEPSIS)
1000.0000 mL | Freq: Once | INTRAVENOUS | Status: AC
  Administered 2018-01-21: 1000 mL via INTRAVENOUS

## 2018-01-21 NOTE — ED Provider Notes (Signed)
[facility] EMERGENCY DEPARTMENT Provider Note   CSN: [id number] Arrival date & time: 01/21/18  1249     History   Chief Complaint Chief Complaint  Patient presents with  . Near Syncope  . Urinary Frequency    HPI Lacresha Torgeson is a 72 y.o. female.  HPI   Patient is a 72 year old female presenting with "feeling strange".  Patient brought by EMS.  Patient has history of A. fib but refuses to take Xarelto.  Patient is here because she felt  "weird " this morning all of a sudden felt multiple times that she needs to use the bathroom.  She urinated close to 10 times.  Then she felt a little "funny".  She felt like her heart was racing.  She was put on her husband's iPhone watch and her heart rate was at 108.  Patient not having chest pain.  No shortness of breath.  Patient did not lose consciousness.  She says she had the same thing happened her last March and they were told her she had low potassium.    Patient's been taking her own potassium at home.  Patient reports a lot of increased stress because her husband has been sick.    Past Medical History:  Diagnosis Date  . Abdominal pain Nov. 2014  . Allergy   . Aneurysm of splenic artery [facility]) Nov 2014   Ref by Dr. Arta Silence  . Benign hematuria   . GERD (gastroesophageal reflux disease)   . History of Crohn's disease   . Hypertension     Patient Active Problem List   Diagnosis Date Noted  . Palpitations 03/11/2017  . AF (paroxysmal atrial fibrillation) ([place]) 03/11/2017  . HTN (hypertension), malignant 03/11/2017  . Atrial fibrillation with RVR ([place]) 02/02/2016  . Splenic artery aneurysm ([place]) 12/01/2013    Past Surgical History:  Procedure Laterality Date  . ABDOMINAL HYSTERECTOMY    . COLONOSCOPY  2006 and 2011   Dr. Sammuel Cooper  . FOOT SURGERY Left 2012  . KNEE SURGERY Right Aug. 2006    OB History    No data available       Home Medications    Prior to Admission medications     Medication Sig Start Date End Date Taking? Authorizing Provider  amLODipine (NORVASC) 5 MG tablet Take 1 tablet (5 mg total) by mouth daily. 02/08/17   Josue Hector, MD  cholecalciferol (VITAMIN D) 1000 units tablet Take 6,000 Units by mouth daily.     [provider]  Coenzyme Q10 (COQ10 PO) Take 1 tablet by mouth daily.    [provider]  LIVALO 1 MG TABS Take 1 mg by mouth 3 (three) times a week.  01/24/16   [provider]  metoprolol (LOPRESSOR) 50 MG tablet Take 1 tablet (50 mg total) by mouth 2 (two) times daily. 02/08/17   Josue Hector, MD  omeprazole (PRILOSEC) 40 MG capsule Take 40 mg by mouth daily.    [provider]  rivaroxaban (XARELTO) 20 MG TABS tablet Take 1 tablet (20 mg total) by mouth daily with supper. 02/08/17   Josue Hector, MD    Family History Family History  Problem Relation Age of Onset  . Heart disease Mother 7       Heart Disease before age 65- Brain Aneurysm  . Hypertension Mother   . Aneurysm Mother        brain  . Heart disease Sister  Brain Aneurysm  . Aneurysm Sister        brain   . Cancer Maternal Grandmother        Colon  . Cancer Brother     Social History Social History   Tobacco Use  . Smoking status: Never Smoker  . Smokeless tobacco: Never Used  Substance Use Topics  . Alcohol use: Yes    Alcohol/week: 0.6 oz    Types: 1 Glasses of wine per week  . Drug use: No     Allergies   Dilaudid [hydromorphone hcl]; Ace inhibitors; Codeine; Crestor [rosuvastatin]; Demerol [meperidine]; Lipitor [atorvastatin]; Zithromax [azithromycin]; Biaxin [clarithromycin]; and Sulfa antibiotics   Review of Systems Review of Systems  Constitutional: Negative for activity change.  Respiratory: Negative for shortness of breath.   Cardiovascular: Negative for chest pain.  Gastrointestinal: Negative for abdominal pain.  Neurological: Positive for light-headedness.  All other systems reviewed and are  negative.    Physical Exam Updated Vital Signs BP (!) 153/88 (BP Location: Right Arm)   Pulse 90   Temp 97.9 F (36.6 C) (Oral)   Resp 18   SpO2 100%   Physical Exam  Constitutional: She is oriented to person, place, and time. She appears well-developed and well-nourished.  HENT:  Head: Normocephalic and atraumatic.  Eyes: Right eye exhibits no discharge. Left eye exhibits no discharge.  Cardiovascular: Regular rhythm and normal heart sounds.  No murmur heard. tachycardia  Pulmonary/Chest: Effort normal and breath sounds normal. She has no wheezes. She has no rales.  Abdominal: Soft. She exhibits no distension. There is no tenderness.  Neurological: She is oriented to person, place, and time. No cranial nerve deficit.  Skin: Skin is warm and dry. She is not diaphoretic.  Psychiatric: She has a normal mood and affect.  Nursing note and vitals reviewed.    ED Treatments / Results  Labs (all labs ordered are listed, but only abnormal results are displayed) Labs Reviewed  COMPREHENSIVE METABOLIC PANEL  CBC WITH DIFFERENTIAL/PLATELET  I-STAT TROPONIN, ED  I-STAT CG4 LACTIC ACID, ED    EKG  EKG Interpretation  Date/Time:  Tuesday January 21 2018 13:01:20 EST Ventricular Rate:  93 PR Interval:    QRS Duration: 86 QT Interval:  347 QTC Calculation: 432 R Axis:   153 Text Interpretation:  Sinus rhythm Anteroseptal infarct, age indeterminate Lateral leads are also involved new t wve inversions in lateral leads.  Confirmed by Zenovia Jarred [phone]) on 01/21/2018 1:34:33 PM       Radiology No results found.  Procedures Procedures (including critical care time)  Medications Ordered in ED Medications - No data to display   Initial Impression / Assessment and Plan / ED Course  I have reviewed the triage vital signs and the nursing notes.  Pertinent labs & imaging results that were available during my care of the patient were reviewed by me and considered in my  medical decision making (see chart for details).     Patient is a 72 year old female presenting with "feeling strange".  Patient brought by EMS.  Patient has history of A. fib but refuses to take Xarelto.  Patient is here because she felt  "weird " this morning all of a sudden felt multiple times that she needs to use the bathroom.  She urinated close to 10 times.  Then she felt a little "funny".  She felt like her heart was racing.  She was put on her husband's iPhone watch and her heart rate was at 108.  Patient not having chest pain.  No shortness of breath.  Patient did not lose consciousness.  She says she had the same thing happened her last March and they were told her she had low potassium.    Patient's been taking her own potassium at home.  Patient reports a lot of increased stress because her husband has been sick.   1:54 PM Is very well-appearing.  I am unsure what this strange feeling she has.  We will do baseline labs, troponin, send urine.   10:24 PM Patient had 2- troponins.  Discussed her EKG findings findings (flipped T waves in the lateral leads) with Dr. Johnsie Cancel.  He thinks that she is safe for discharge and will follow-up as an outpatient.  Final Clinical Impressions(s) / ED Diagnoses   Final diagnoses:  None    ED Discharge Orders    None       Macarthur Critchley, MD 01/21/18 2224

## 2018-01-21 NOTE — ED Triage Notes (Signed)
Pt arrives from home via [facility] reporting 2 episodes of near syncope earlier today, c/o palpitations and weakness during episodes. Pt reports increased urinary frequency, denies dysuria, fever/chills.  Pt reports hx afib with syncope, reports taking all meds as prescribed. AOx4 on arrival, resp e/u.

## 2018-01-21 NOTE — ED Notes (Signed)
Pt family provided with [place] sandwich and diet ginger ale.

## 2018-01-21 NOTE — Discharge Instructions (Signed)
We will need to follow-up with your primary care and your cardiologist.  Both your troponins were normal today her EKG was reassuring and all of your labs are normal.  Is possible that your feelings also could be related to anxiety, so please touch base with your primary care about this.

## 2018-01-21 NOTE — ED Notes (Signed)
Patient ambulated to restroom. Walked with husband. Steady gait.

## 2018-02-11 IMAGING — CT CT HEAD W/O CM
3 of 4 series · 18 of 47 positions shown, 21 images · non-contrast
Comparison: None.

CLINICAL DATA: Headache and dizziness

EXAM:
CT HEAD WITHOUT CONTRAST
TECHNIQUE: Contiguous axial images were obtained from the base of the skull
through the vertex without intravenous contrast.

[Series 201: head w/o, idose (1) · axial · non-contrast · 0.41mm/px · z∈[+61,+181]mm · 12 of 28 slices shown, 15 images]
[im 2/28  brain]
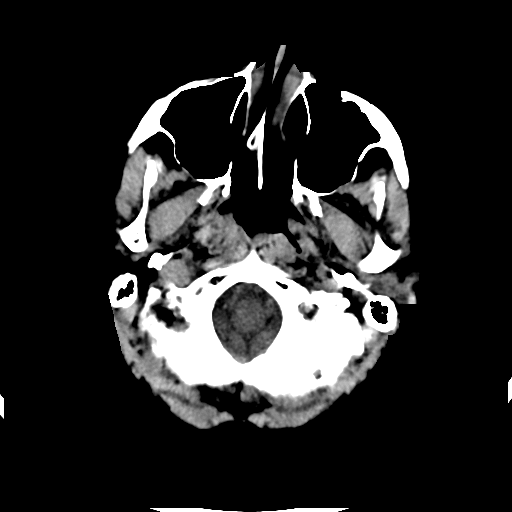
[im 2/28  bone]
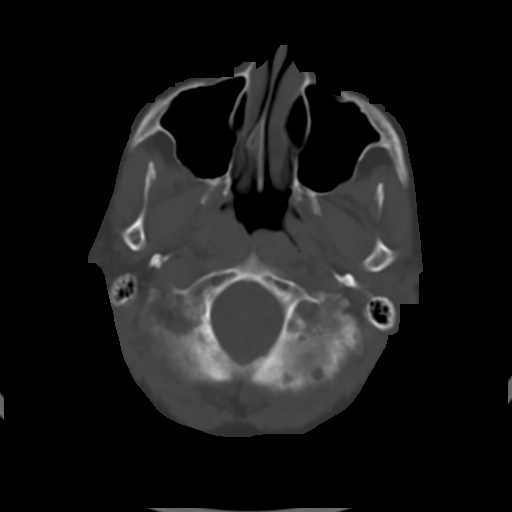
[im 4/28  brain]
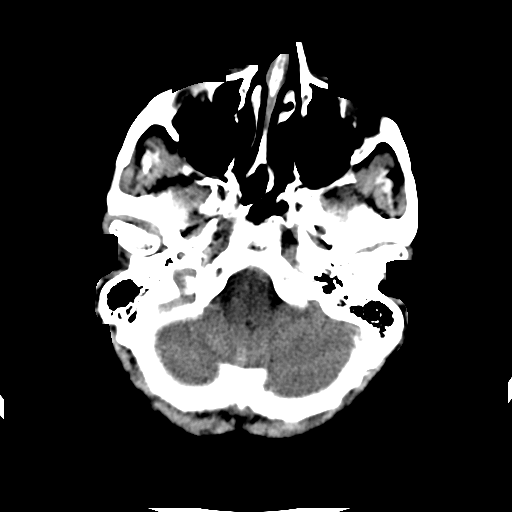
[im 6/28  brain]
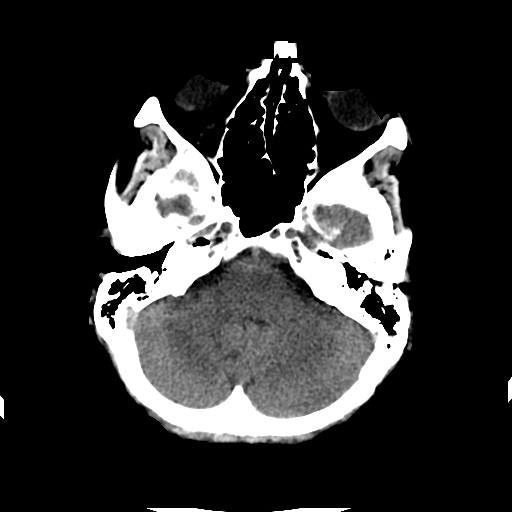
[im 8/28  brain]
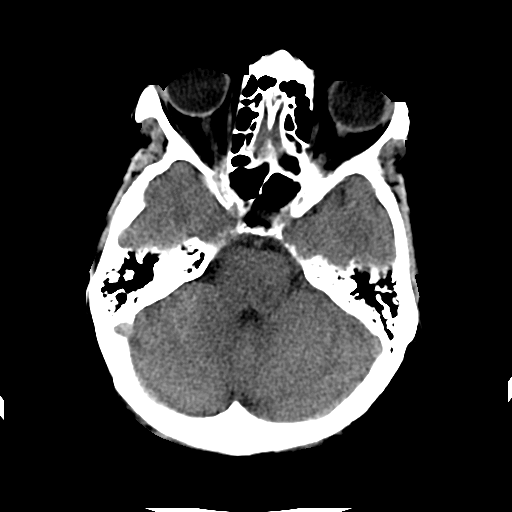
[im 10/28  brain]
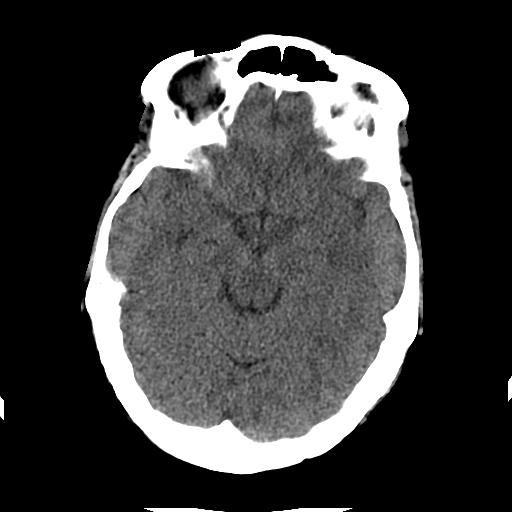
[im 10/28  bone]
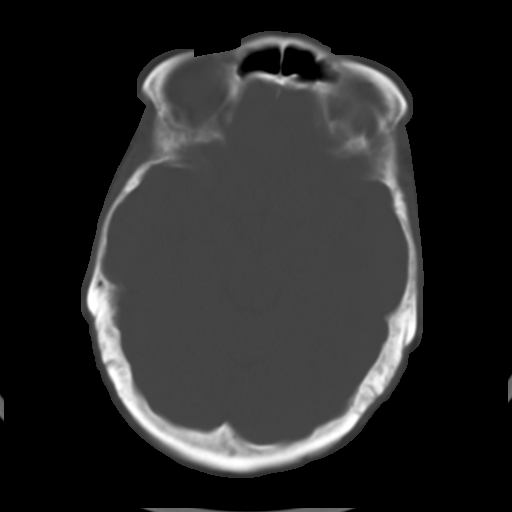
[im 12/28  brain]
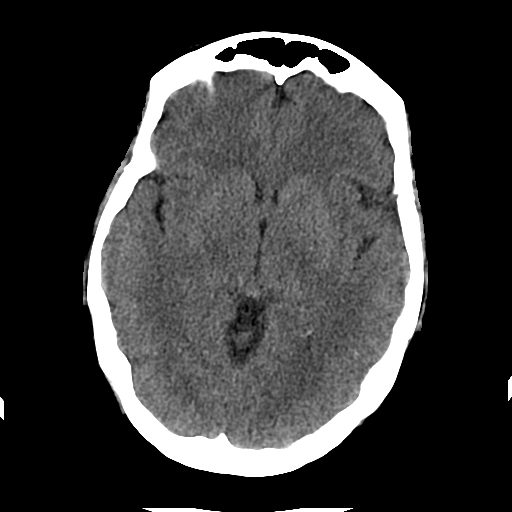
[im 16/28  brain]
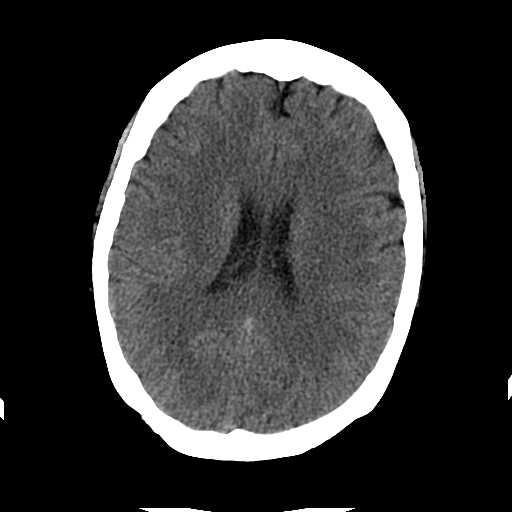
[im 18/28  brain]
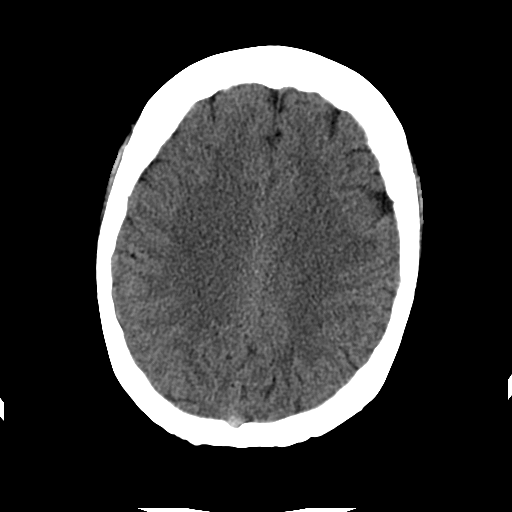
[im 20/28  brain]
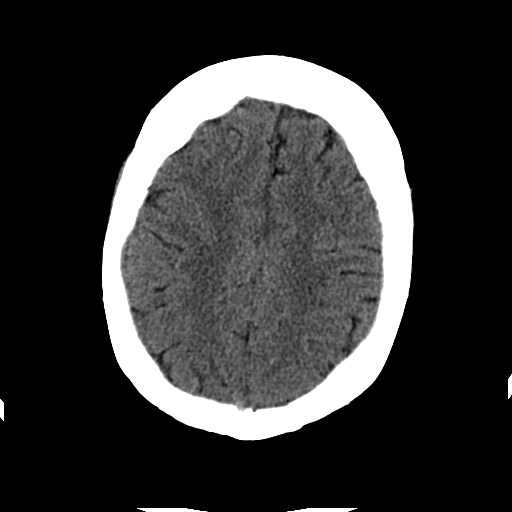
[im 20/28  bone]
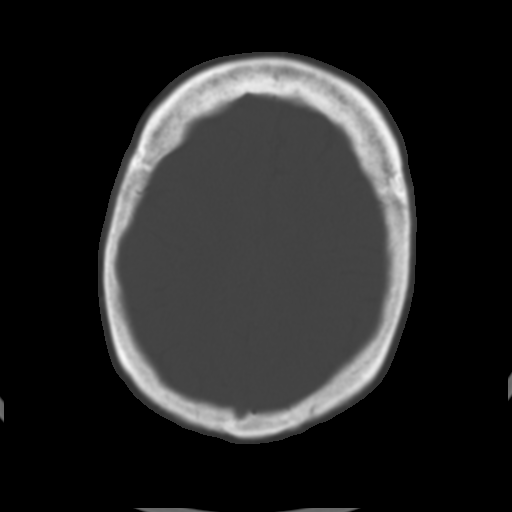
[im 22/28  brain]
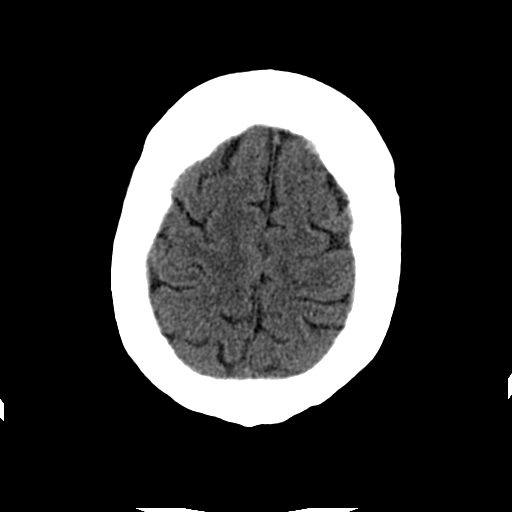
[im 24/28  brain]
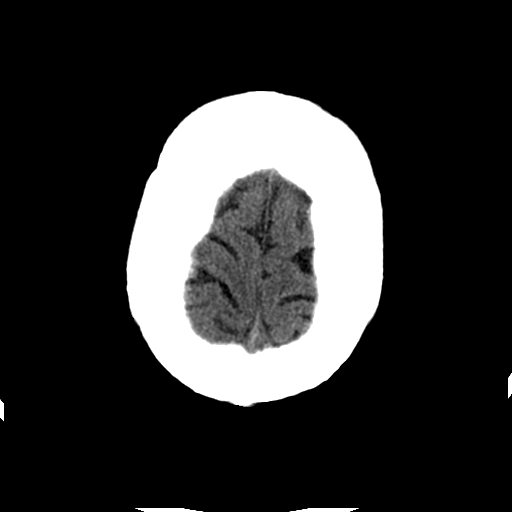
[im 26/28  brain]
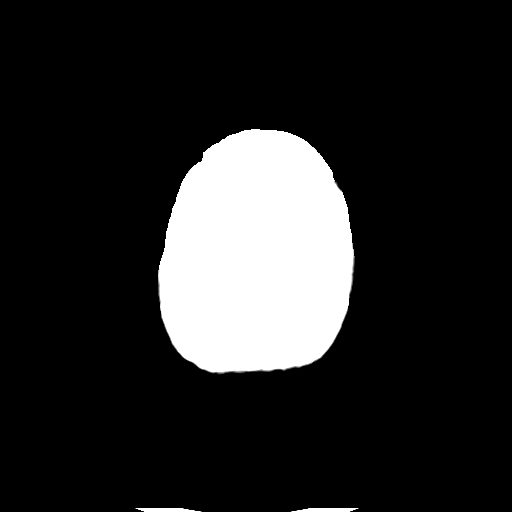

[Series 203: coronal st, idose (1) · coronal · 0.40mm/px · 3 of 62 slices shown]
[im 21/62  brain]
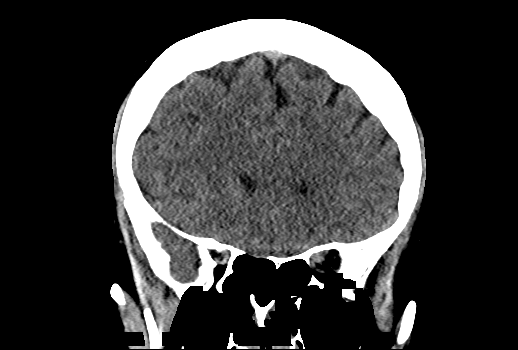
[im 28/62  brain]
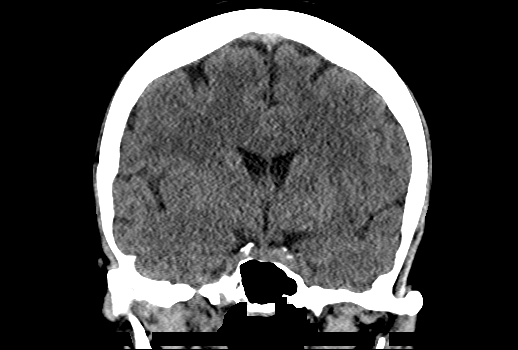
[im 34/62  brain]
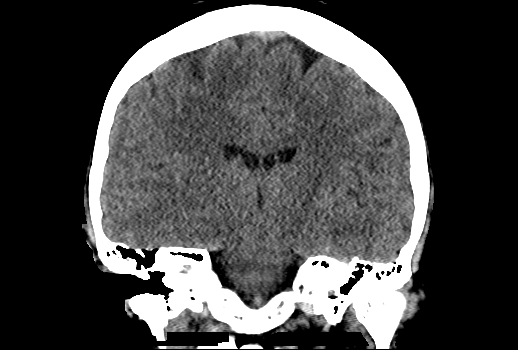

[Series 204: sagittal st, idose (1) · sagittal · 0.40mm/px · 3 of 50 slices shown]
[im 17/50  brain]
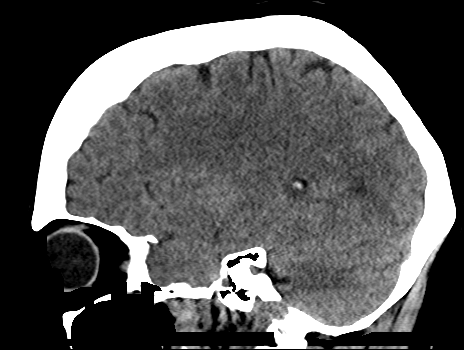
[im 25/50  brain]
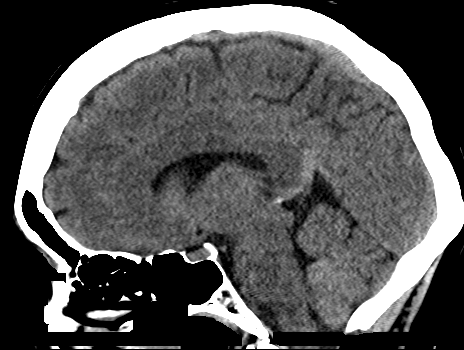
[im 33/50  brain]
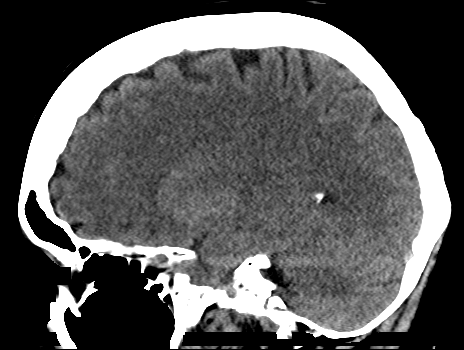

[18 of 47 positions shown; findings below may reference images not displayed]

FINDINGS: Brain: The ventricles are normal in size and configuration. There is
no intracranial mass, hemorrhage, extra-axial fluid collection, or
midline shift. Gray-white compartments are normal. No evident acute
infarct.

Vascular: No hyperdense vessel is appreciable. There is no
appreciable vascular calcification.

Skull: Bony calvarium appears intact.

Sinuses/Orbits: There is slight mucosal thickening in several
ethmoid air cells. Other visualized paranasal sinuses clear. Orbits
appear symmetric bilaterally.

Other: Mastoid air cells are clear.

There is soft tissue asymmetry immediately inferior to the left
external auditory canal compared to the right side. Significance of
this finding is uncertain.
IMPRESSION: Slight ethmoid sinus disease bilaterally. No intracranial mass,
hemorrhage, or extra-axial fluid collection. Gray-white compartments
appear normal.

Soft tissue prominence immediately inferior to the left external
auditory canal/external ear compared to the right side. Significance
of this finding uncertain. Clinical assessment of this area may be
indicated.

## 2018-02-13 NOTE — Progress Notes (Signed)
Patient ID: Mary Riley, female   DOB: February 06, 1946, 72 y.o.   MRN: [id number]    Primary Care Physician: Harlan Stains, MD Referring Physician: [facility] f/u    Mary Riley is a 72 y.o. [name] female with a history of HTN and aneurysm of splenic artery who brought by EMS to [facility] ED  02/03/16 for evaluation of new onset afib.   HPI:   72 y.o. HTN, PAF, Splenic Artery Aneurysm.  Maintained on xarelto and metoprolol. Echo 03/12/17  EF 63-14% Grade 2  Diastolic dysfunction Some edema in ankles but did not want to change Norvasc to verapamil Event monitor reviewed 03/25/17 and no PAF NSR.   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 Abdominal pain Nov. 2014  . Allergy   . Aneurysm of splenic artery [facility]) Nov 2014   Ref by Dr. Arta Silence  . Benign hematuria   . GERD (gastroesophageal reflux disease)   . History of Crohn's disease   . Hypertension    Past Surgical History:  Procedure Laterality Date  . ABDOMINAL HYSTERECTOMY    . COLONOSCOPY  2006 and 2011   Dr. Sammuel Cooper  . FOOT SURGERY Left 2012  . KNEE SURGERY Right Aug. 2006    Current Outpatient Medications  Medication Sig Dispense Refill  . amLODipine (NORVASC) 5 MG tablet Take 1 tablet (5 mg total) by mouth daily. 90 tablet 3  . cholecalciferol (VITAMIN D) 1000 units tablet Take 6,000 Units by mouth daily.     . Coenzyme Q10 (COQ10 PO) Take 1 tablet by mouth daily.    [name] LIVALO 1 MG TABS Take 1 mg by mouth 3 (three) times a week.   1  . metoprolol (LOPRESSOR) 50 MG tablet Take 1 tablet (50 mg total) by mouth 2 (two) times daily. 180 tablet 3  . omeprazole (PRILOSEC) 40 MG capsule Take 40 mg by mouth daily.     No current facility-administered medications for this visit.     Allergies  Allergen Reactions  . Dilaudid [Hydromorphone Hcl] Nausea And Vomiting   . Ace Inhibitors Cough  . Codeine Nausea Only  . Crestor [Rosuvastatin] Other (See Comments)    Joints hurt  . Demerol [Meperidine] Nausea Only  . Lipitor [Atorvastatin] Other (See Comments)    Joints hurt  . Zithromax [Azithromycin]     unknown  . Biaxin [Clarithromycin] Rash  . Sulfa Antibiotics Rash    Social History   Socioeconomic History  . Marital status: Married    Spouse name: Not on file  . Number of children: Not on file  . Years of education: Not on file  . Highest education level: Not on file  Social Needs  . Financial resource strain: Not on file  . Food insecurity - worry: Not on file  . Food insecurity - inability: Not on file  . Transportation needs - medical: Not on file  . Transportation needs - non-medical: Not on file  Occupational History  . Not on file  Tobacco Use  . Smoking status: Never Smoker  . Smokeless tobacco: Never Used  Substance and Sexual Activity  . Alcohol use: Yes    Alcohol/week: 0.6 oz    Types: 1 Glasses of wine per week  . Drug use: No  . Sexual activity: Not on file  Other Topics Concern  . Not on file  Social History Narrative  . Not on file    Family History  Problem Relation Age of Onset  . Heart disease Mother 11       Heart Disease before age 81- Brain Aneurysm  . Hypertension Mother   . Aneurysm Mother        brain  . Heart disease Sister        Brain Aneurysm  . Aneurysm Sister        brain   . Cancer Maternal Grandmother        Colon  . Cancer Brother     ROS- All systems are reviewed and negative except as per the HPI above  Physical Exam: Vitals:   02/18/18 1058  BP: 124/74  Pulse: 72  SpO2: 99%  Weight: 162 lb (73.5 kg)  Height: 5' 4.25" (1.632 m)    Affect appropriate Healthy:  appears stated age HEENT: normal Neck supple with no adenopathy JVP normal no bruits no thyromegaly Lungs clear with no wheezing and good diaphragmatic motion Heart:  S1/S2 no murmur, no rub, gallop or  click PMI normal Abdomen: benighn, BS positve, no tenderness, no AAA no bruit.  No HSM or HJR Distal pulses intact with no bruits No edema Neuro non-focal Skin warm and dry No muscular weakness    ECG     - 02/09/16  NSR at 67 bpm, normal ekg  02/08/17 SR rate 73 normal     Plan:  HTN: Well controlled.  offerred to change norvasc to verapamil for ? Edema but she does not want to change now    Afib:   Continue xarelto   CHADVASC 3  GERD low carb diet continue prilosec  Splenic Aneurysm reveiwed CT Abdomen 01/2016  Mostly thrombosed 13-14 mm stable in size f/u Early  Chol:  On statin labs with primary Eula Fried

## 2018-02-18 ENCOUNTER — Encounter: Payer: Self-pay | Admitting: Cardiovascular Disease

## 2018-02-18 ENCOUNTER — Ambulatory Visit: Payer: Medicare Other | Admitting: Cardiovascular Disease

## 2018-02-18 VITALS — BP 124/74 | HR 72 | Ht 64.25 in | Wt 162.0 lb

## 2018-02-18 DIAGNOSIS — I480 Paroxysmal atrial fibrillation: Secondary | ICD-10-CM | POA: Diagnosis not present

## 2018-02-18 DIAGNOSIS — I10 Essential (primary) hypertension: Secondary | ICD-10-CM

## 2018-02-18 NOTE — Patient Instructions (Addendum)

## 2018-04-20 ENCOUNTER — Other Ambulatory Visit: Payer: Self-pay | Admitting: Cardiovascular Disease

## 2019-02-06 ENCOUNTER — Other Ambulatory Visit: Payer: Self-pay | Admitting: Cardiovascular Disease

## 2019-02-18 ENCOUNTER — Ambulatory Visit: Payer: Medicare Other | Admitting: Cardiovascular Disease

## 2019-02-24 ENCOUNTER — Other Ambulatory Visit: Payer: Self-pay | Admitting: Gastroenterology

## 2019-02-24 DIAGNOSIS — R634 Abnormal weight loss: Secondary | ICD-10-CM

## 2019-02-24 DIAGNOSIS — R1013 Epigastric pain: Secondary | ICD-10-CM

## 2019-02-24 DIAGNOSIS — R1310 Dysphagia, unspecified: Secondary | ICD-10-CM

## 2019-02-25 ENCOUNTER — Other Ambulatory Visit: Payer: Self-pay | Admitting: Gastroenterology

## 2019-02-25 DIAGNOSIS — R634 Abnormal weight loss: Secondary | ICD-10-CM

## 2019-02-25 DIAGNOSIS — R1013 Epigastric pain: Secondary | ICD-10-CM

## 2019-02-26 ENCOUNTER — Inpatient Hospital Stay: Admission: RE | Admit: 2019-02-26 | Payer: Medicare Other | Source: Ambulatory Visit

## 2019-03-02 ENCOUNTER — Other Ambulatory Visit: Payer: Medicare Other

## 2019-03-11 ENCOUNTER — Ambulatory Visit
Admission: RE | Admit: 2019-03-11 | Discharge: 2019-03-11 | Disposition: A | Discharge status: Home / Self Care | Payer: Medicare Other | Source: Ambulatory Visit | Attending: Gastroenterology | Admitting: Gastroenterology

## 2019-03-11 ENCOUNTER — Other Ambulatory Visit: Payer: Self-pay

## 2019-03-11 DIAGNOSIS — R1013 Epigastric pain: Secondary | ICD-10-CM

## 2019-03-11 DIAGNOSIS — R634 Abnormal weight loss: Secondary | ICD-10-CM

## 2019-03-11 MED ORDER — IOPAMIDOL (ISOVUE-300) INJECTION 61%
100.0000 mL | Freq: Once | INTRAVENOUS | Status: AC | PRN
  Administered 2019-03-11: 100 mL via INTRAVENOUS

## 2019-03-17 ENCOUNTER — Other Ambulatory Visit: Payer: Medicare Other

## 2019-03-17 ENCOUNTER — Telehealth: Payer: Self-pay

## 2019-03-17 NOTE — Telephone Encounter (Signed)
I left a message for the patient to return my call about appointment.  

## 2019-03-18 ENCOUNTER — Telehealth: Payer: Self-pay

## 2019-03-18 NOTE — Telephone Encounter (Signed)
Virtual Visit Pre-Appointment Phone Call  Steps For Call:  1. Confirm consent - "In the setting of the current Covid19 crisis, you are scheduled for a (phone or video) visit with your provider on (date) at (time).  Just as we do with many in-office visits, in order for you to participate in this visit, we must obtain consent.  If you'd like, I can send this to your mychart (if signed up) or email for you to review.  Otherwise, I can obtain your verbal consent now.  All virtual visits are billed to your insurance company just like a normal visit would be.  By agreeing to a virtual visit, we'd like you to understand that the technology does not allow for your provider to perform an examination, and thus may limit your provider's ability to fully assess your condition.  Finally, though the technology is pretty good, we cannot assure that it will always work on either your or our end, and in the setting of a video visit, we may have to convert it to a phone-only visit.  In either situation, we cannot ensure that we have a secure connection.  Are you willing to proceed?"  2. Give patient instructions for WebEx download to smartphone as below if video visit  3. Advise patient to be prepared with any vital sign or heart rhythm information, their current medicines, and a piece of paper and pen handy for any instructions they may receive the day of their visit  4. Inform patient they will receive a phone call 15 minutes prior to their appointment time (may be from unknown caller ID) so they should be prepared to answer  5. Confirm that appointment type is correct in Epic appointment notes (video vs telephone)    TELEPHONE CALL NOTE  Mary Riley has been deemed a candidate for a follow-up tele-health visit to limit community exposure during the Covid-19 pandemic. I spoke with the patient via phone to ensure availability of phone/video source, confirm preferred email & phone number, and discuss  instructions and expectations.  I reminded Mary Riley to be prepared with any vital sign and/or heart rhythm information that could potentially be obtained via home monitoring, at the time of her visit. I reminded Mary Riley to expect a phone call at the time of her visit if her visit.  Did the patient verbally acknowledge consent to treatment? Yes  Mary Riley, [place] 03/18/2019 9:30 AM   DOWNLOADING THE WEBEX SOFTWARE TO SMARTPHONE  - If Apple, go to [employer] and type in WebEx in the search bar. [place] [employer], the blue/green circle. The app is free but as with any other app downloads, their phone may require them to verify saved payment information or Apple password. The patient does NOT have to create an account.  - If Android, ask patient to go to [employer] and type in WebEx in the search bar. [place] [employer], the blue/green circle. The app is free but as with any other app downloads, their phone may require them to verify saved payment information or Android password. The patient does NOT have to create an account.   CONSENT FOR TELE-HEALTH VISIT - PLEASE REVIEW  I hereby voluntarily request, consent and authorize [facility] and its employed or contracted physicians, physician assistants, nurse practitioners or other licensed health care professionals (the Practitioner), to provide me with telemedicine health care services (the "Services") as deemed necessary by the treating Practitioner. I acknowledge and  consent to receive the Services by the Practitioner via telemedicine. I understand that the telemedicine visit will involve communicating with the Practitioner through live audiovisual communication technology and the disclosure of certain medical information by electronic transmission. I acknowledge that I have been given the opportunity to request an in-person assessment or other available alternative prior to the telemedicine visit and am  voluntarily participating in the telemedicine visit.  I understand that I have the right to withhold or withdraw my consent to the use of telemedicine in the course of my care at any time, without affecting my right to future care or treatment, and that the Practitioner or I may terminate the telemedicine visit at any time. I understand that I have the right to inspect all information obtained and/or recorded in the course of the telemedicine visit and may receive copies of available information for a reasonable fee.  I understand that some of the potential risks of receiving the Services via telemedicine include:  [name] Delay or interruption in medical evaluation due to technological equipment failure or disruption; . Information transmitted may not be sufficient (e.g. poor resolution of images) to allow for appropriate medical decision making by the Practitioner; and/or  . In rare instances, security protocols could fail, causing a breach of personal health information.  Furthermore, I acknowledge that it is my responsibility to provide information about my medical history, conditions and care that is complete and accurate to the best of my ability. I acknowledge that Practitioner's advice, recommendations, and/or decision may be based on factors not within their control, such as incomplete or inaccurate data provided by me or distortions of diagnostic images or specimens that may result from electronic transmissions. I understand that the practice of medicine is not an exact science and that Practitioner makes no warranties or guarantees regarding treatment outcomes. I acknowledge that I will receive a copy of this consent concurrently upon execution via email to the email address I last provided but may also request a printed copy by calling the office of [place].    I understand that my insurance will be billed for this visit.   I have read or had this consent read to me. . I understand the  contents of this consent, which adequately explains the benefits and risks of the Services being provided via telemedicine.  . I have been provided ample opportunity to ask questions regarding this consent and the Services and have had my questions answered to my satisfaction. . I give my informed consent for the services to be provided through the use of telemedicine in my medical care  By participating in this telemedicine visit I agree to the above.

## 2019-03-22 NOTE — Progress Notes (Signed)
Virtual Visit via Video Note    Evaluation Performed:  Follow-up visit  This visit type was conducted due to national recommendations for restrictions regarding the COVID-19 Pandemic (e.g. social distancing).  This format is felt to be most appropriate for this patient at this time.  All issues noted in this document were discussed and addressed.  No physical exam was performed (except for noted visual exam findings with Video Visits).  Please refer to the patient's chart (MyChart message for video visits and phone note for telephone visits) for the patient's consent to telehealth for [facility]. We did experience some technical difficulties with the video portion of webex meeting   Date:  03/24/2019   ID:  Mary Riley, DOB [birth year], MRN [id number]  Patient Location:  Home  Provider location:   Office  PCP:  Harlan Stains, MD  Cardiologist:  Johnsie Cancel Electrophysiologist:  None   Chief Complaint: F/U Afib  History of Present Illness:    Mary Riley is a 73 y.o. female who presents via audio/video conferencing for a telehealth visit today.   History of HTN, splenic artery aneurysm and PAF orginally noted 02/03/16 CHADVASC 3 maintained on xarelto and lopressor Echo 03/12/17  EF 60-65% Grade 2 Last event monitor 03/25/17 NSR no PaF  She use to work at [employer] and knows my brother Spends time with grandson who hunts/fishes  Husband sees Harwani/Webb with CRF And heart disease finally passed away during heart cath 11/04/23   Has lost about 20 lbs not eating chocolate and walking has large hiatal hernia and worsening GERD seeing [facility]  The patient does not have symptoms concerning for COVID-19 infection (fever, chills, cough, or new shortness of breath).    Prior CV studies:   The following studies were reviewed today:  TTE 2018 Event monitor 2018  Past Medical History:  Diagnosis Date  . Abdominal pain Nov. 2014  . Allergy   . Aneurysm of splenic  artery [facility]) Nov 2014   Ref by Dr. Arta Silence  . Benign hematuria   . GERD (gastroesophageal reflux disease)   . History of Crohn's disease   . Hypertension    Past Surgical History:  Procedure Laterality Date  . ABDOMINAL HYSTERECTOMY    . COLONOSCOPY  2006 and 2011   Dr. Sammuel Cooper  . FOOT SURGERY Left 2012  . KNEE SURGERY Right Aug. 2006     Current Meds  Medication Sig  . amLODipine (NORVASC) 5 MG tablet Take 1 tablet (5 mg total) by mouth daily.  [name] LIVALO 1 MG TABS Take 1 mg by mouth 3 (three) times a week.   . metoprolol tartrate (LOPRESSOR) 50 MG tablet TAKE 1 TABLET(50 MG) BY MOUTH TWICE DAILY  . pantoprazole (PROTONIX) 40 MG tablet Take 40 mg by mouth daily.  . [DISCONTINUED] omeprazole (PRILOSEC) 40 MG capsule Take 40 mg by mouth daily.     Allergies:   Dilaudid [hydromorphone hcl]; Ace inhibitors; Codeine; Crestor [rosuvastatin]; Demerol [meperidine]; Lipitor [atorvastatin]; Zithromax [azithromycin]; Biaxin [clarithromycin]; and Sulfa antibiotics   Social History   Tobacco Use  . Smoking status: Never Smoker  . Smokeless tobacco: Never Used  Substance Use Topics  . Alcohol use: Yes    Alcohol/week: 1.0 standard drinks    Types: 1 Glasses of wine per week  . Drug use: No     Family Hx: The patient's family history includes Aneurysm in her mother and sister; Cancer in her brother and maternal grandmother; Heart disease in her  sister; Heart disease (age of onset: 10) in her mother; Hypertension in her mother.  ROS:   Please see the history of present illness.     All other systems reviewed and are negative.   Labs/Other Tests and Data Reviewed:    Recent Labs: No results found for requested labs within last 8760 hours.   Recent Lipid Panel Lab Results  Component Value Date/Time   CHOL 259 (H) 02/03/2016 04:21 AM   TRIG 124 02/03/2016 04:21 AM   HDL 72 02/03/2016 04:21 AM   CHOLHDL 3.6 02/03/2016 04:21 AM   LDLCALC 162 (H) 02/03/2016 04:21 AM     Wt Readings from Last 3 Encounters:  03/24/19 64.3 kg  02/18/18 73.5 kg  06/11/17 75.3 kg     Objective:    Vital Signs:  BP 115/75   Pulse 99   Ht 5' 3.5" (1.613 m)   Wt 64.3 kg   BMI 24.72 kg/m    Well nourished, well developed female in no acute distress. Skin warm and dry No tachypnea No JVP No edema   ASSESSMENT & PLAN:    HTN: Well controlled.  offerred to change norvasc to verapamil for ? Edema but she does not want to change now    Afib:   Continue xarelto   CHADVASC 3  GERD low carb diet continue prilosec  Splenic Aneurysm reveiwed CT Abdomen 01/2016  Mostly thrombosed 13-14 mm stable in size f/u Early not noted on recent abdominal CT   Chol:  On statin labs with primary Harlan Stains   COVID-19 Education: The signs and symptoms of COVID-19 were discussed with the patient and how to seek care for testing (follow up with PCP or arrange E-visit).  The importance of social distancing was discussed today.  Patient Risk:   After full review of this patient's clinical status, I feel that they are at least moderate risk at this time.  Time:   Today, I have spent 30 minutes with the patient with telehealth technology discussing PAF, Anticoagulation, HTN.     Medication Adjustments/Labs and Tests Ordered: Current medicines are reviewed at length with the patient today.  Concerns regarding medicines are outlined above.  Tests Ordered: No orders of the defined types were placed in this encounter.  Medication Changes: No orders of the defined types were placed in this encounter.   Disposition:  Follow up  6 months   Signed, Jenkins Rouge, MD  03/24/2019 10:20 AM    [place]

## 2019-03-24 ENCOUNTER — Other Ambulatory Visit: Payer: Self-pay

## 2019-03-24 ENCOUNTER — Telehealth (INDEPENDENT_AMBULATORY_CARE_PROVIDER_SITE_OTHER): Payer: Medicare Other | Admitting: Cardiovascular Disease

## 2019-03-24 ENCOUNTER — Encounter: Payer: Self-pay | Admitting: Cardiovascular Disease

## 2019-03-24 VITALS — BP 115/75 | HR 99 | Ht 63.5 in | Wt 141.8 lb

## 2019-03-24 DIAGNOSIS — I480 Paroxysmal atrial fibrillation: Secondary | ICD-10-CM

## 2019-03-24 NOTE — Patient Instructions (Signed)

## 2019-04-03 ENCOUNTER — Ambulatory Visit
Admission: RE | Admit: 2019-04-03 | Discharge: 2019-04-03 | Disposition: A | Discharge status: Home / Self Care | Payer: Medicare Other | Source: Ambulatory Visit | Attending: Gastroenterology | Admitting: Gastroenterology

## 2019-04-03 DIAGNOSIS — R1310 Dysphagia, unspecified: Secondary | ICD-10-CM

## 2019-04-16 ENCOUNTER — Other Ambulatory Visit: Payer: Self-pay | Admitting: Family Medicine

## 2019-04-16 DIAGNOSIS — I728 Aneurysm of other specified arteries: Secondary | ICD-10-CM

## 2019-06-02 ENCOUNTER — Inpatient Hospital Stay: Admission: RE | Admit: 2019-06-02 | Payer: Medicare Other | Source: Ambulatory Visit

## 2019-11-04 ENCOUNTER — Encounter: Payer: Self-pay | Admitting: Neurology

## 2019-11-04 ENCOUNTER — Other Ambulatory Visit: Payer: Self-pay

## 2019-11-04 ENCOUNTER — Other Ambulatory Visit: Payer: Self-pay | Admitting: Neurology

## 2019-11-04 ENCOUNTER — Ambulatory Visit: Payer: Medicare Other | Admitting: Neurology

## 2019-11-04 VITALS — BP 179/77 | HR 71 | Temp 97.7°F | Ht 63.75 in | Wt 148.0 lb

## 2019-11-04 DIAGNOSIS — I4891 Unspecified atrial fibrillation: Secondary | ICD-10-CM

## 2019-11-04 DIAGNOSIS — N182 Chronic kidney disease, stage 2 (mild): Secondary | ICD-10-CM

## 2019-11-04 DIAGNOSIS — I10 Essential (primary) hypertension: Secondary | ICD-10-CM | POA: Diagnosis not present

## 2019-11-04 DIAGNOSIS — G459 Transient cerebral ischemic attack, unspecified: Secondary | ICD-10-CM | POA: Diagnosis not present

## 2019-11-04 DIAGNOSIS — R413 Other amnesia: Secondary | ICD-10-CM

## 2019-11-04 NOTE — Patient Instructions (Signed)
Transient Ischemic Attack ° °A transient ischemic attack (TIA) is a "warning stroke" that causes stroke-like symptoms that go away quickly. A TIA does not cause lasting damage to the brain. But having a TIA is a sign that you may be at risk for a stroke. Lifestyle changes and medical treatments can help prevent a stroke. °It is important to know the symptoms of a TIA and what to do. Get help right away, even if your symptoms go away. The symptoms of a TIA are the same as those of a stroke. They can happen fast, and they usually go away within minutes or hours. They can include: °· Weakness or loss of feeling in your face, arm, or leg. This often happens on one side of your body. °· Trouble walking. °· Trouble moving your arms or legs. °· Trouble talking or understanding what people are saying. °· Trouble seeing. °· Seeing two of one object (double vision). °· Feeling dizzy. °· Feeling confused. °· Loss of balance or coordination. °· Feeling sick to your stomach (nauseous) and throwing up (vomiting). °· A very bad headache for no reason. °What increases the risk? °Certain things may make you more likely to have a TIA. Some of these are things that you can change, such as: °· Being very overweight (obese). °· Using products that contain nicotine or tobacco, such as cigarettes and e-cigarettes. °· Taking birth control pills. °· Not being active. °· Drinking too much alcohol. °· Using drugs. °Other risk factors include: °· Having an irregular heartbeat (atrial fibrillation). °· Being African American or Hispanic. °· Having had blood clots, stroke, TIA, or heart attack in the past. °· Being a woman with a history of high blood pressure in pregnancy (preeclampsia). °· Being over the age of 60. °· Being female. °· Having family history of stroke. °· Having the following diseases or conditions: °? High blood pressure. °? High cholesterol. °? Diabetes. °? Heart disease. °? Sickle cell disease. °? Sleep apnea. °? Migraine  headache. °? Long-term (chronic) diseases that cause soreness and swelling (inflammation). °? Disorders that affect how your blood clots. °Follow these instructions at home: °Medicines ° °· Take over-the-counter and prescription medicines only as told by your doctor. °· If you were told to take aspirin or another medicine to thin your blood, take it exactly as told by your doctor. °? Taking too much of the medicine can cause bleeding. °? Taking too little of the medicine may not work to treat the problem. °Eating and drinking ° °· Eat 5 or more servings of fruits and vegetables each day. °· Follow instructions from your doctor about your diet. You may need to follow a certain diet to help lower your risk of having a stroke. You may need to: °? Eat a diet that is low in fat and salt. °? Eat foods that contain a lot of fiber. °? Limit the amount of carbohydrates and sugar in your diet. °· Limit alcohol intake to 1 drink a day for nonpregnant women and 2 drinks a day for men. One drink equals 12 oz of beer, 5 oz of wine, or 1½ oz of hard liquor. °General instructions °· Keep a healthy weight. °· Stay active. Try to get at least 30 minutes of activity on all or most days. °· Find out if you have a condition called sleep apnea. Get treatment if needed. °· Do not use any products that contain nicotine or tobacco, such as cigarettes and e-cigarettes. If you need help quitting,   ask your doctor. °· Do not abuse drugs. °· Keep all follow-up visits as told by your doctor. This is important. °Get help right away if: °· You have any signs of stroke. "BE FAST" is an easy way to remember the main warning signs: °? B - Balance. Signs are dizziness, sudden trouble walking, or loss of balance. °? E - Eyes. Signs are trouble seeing or a sudden change in how you see. °? F - Face. Signs are sudden weakness or loss of feeling of the face, or the face or eyelid drooping on one side. °? A - Arms. Signs are weakness or loss of feeling in an  arm. This happens suddenly and usually on one side of the body. °? S - Speech. Signs are sudden trouble speaking, slurred speech, or trouble understanding what people say. °? T - Time. Time to call emergency services. Write down what time symptoms started. °· You have other signs of stroke, such as: °? A sudden, very bad headache with no known cause. °? Feeling sick to your stomach (nausea). °? Throwing up (vomiting). °? Jerky movements that you cannot control (seizure). °These symptoms may be an emergency. Do not wait to see if the symptoms will go away. Get medical help right away. Call your local emergency services (911 in the [place] Do not drive yourself to the hospital. °Summary °· A transient ischemic attack (TIA) is a "warning stroke" that causes stroke-like symptoms that go away quickly. °· A TIA is a medical emergency. Get help right away, even if your symptoms go away. °· A TIA does not cause lasting damage to the brain. °· Having a TIA is a sign that you may be at risk for a stroke. Lifestyle changes and medical treatments can help prevent a stroke. °This information is not intended to replace advice given to you by your health care provider. Make sure you discuss any questions you have with your health care provider. °Document Released: 09/11/2008 Document Revised: 08/29/2018 Document Reviewed: 03/06/2017 °Elsevier Patient Education © 2020 [employer] ° °
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
## 2019-11-04 NOTE — Addendum Note (Signed)
Addended by: Larey Seat on: 11/04/2019 01:47 PM   Modules accepted: Orders

## 2019-11-05 ENCOUNTER — Emergency Department (HOSPITAL_COMMUNITY): Payer: Medicare Other

## 2019-11-05 ENCOUNTER — Other Ambulatory Visit: Payer: Self-pay

## 2019-11-05 ENCOUNTER — Emergency Department (HOSPITAL_COMMUNITY)
Admission: EM | Admit: 2019-11-05 | Discharge: 2019-11-06 | Disposition: A | Discharge status: Home / Self Care | Payer: Medicare Other | Attending: Emergency Medicine | Admitting: Emergency Medicine

## 2019-11-05 DIAGNOSIS — I10 Essential (primary) hypertension: Secondary | ICD-10-CM

## 2019-11-05 DIAGNOSIS — I4891 Unspecified atrial fibrillation: Secondary | ICD-10-CM | POA: Insufficient documentation

## 2019-11-05 DIAGNOSIS — R002 Palpitations: Secondary | ICD-10-CM | POA: Diagnosis present

## 2019-11-05 DIAGNOSIS — I129 Hypertensive chronic kidney disease with stage 1 through stage 4 chronic kidney disease, or unspecified chronic kidney disease: Secondary | ICD-10-CM | POA: Insufficient documentation

## 2019-11-05 DIAGNOSIS — N182 Chronic kidney disease, stage 2 (mild): Secondary | ICD-10-CM | POA: Diagnosis not present

## 2019-11-05 LAB — BASIC METABOLIC PANEL
Anion gap: 10 (ref 5–15)
BUN: 14 mg/dL (ref 8–23)
CO2: 23 mmol/L (ref 22–32)
Calcium: 9.6 mg/dL (ref 8.9–10.3)
Chloride: 104 mmol/L (ref 98–111)
Creatinine, Ser: 1.18 mg/dL — ABNORMAL HIGH (ref 0.44–1.00)
GFR calc Af Amer: 53 mL/min — ABNORMAL LOW (ref >60)
GFR calc non Af Amer: 45 mL/min — ABNORMAL LOW (ref >60)
Glucose, Bld: 126 mg/dL — ABNORMAL HIGH (ref 70–99)
Potassium: 3.7 mmol/L (ref 3.5–5.1)
Sodium: 137 mmol/L (ref 135–145)

## 2019-11-05 LAB — CBC
HCT: 40 % (ref 36.0–46.0)
Hemoglobin: 12.9 g/dL (ref 12.0–15.0)
MCH: 30.7 pg (ref 26.0–34.0)
MCHC: 32.3 g/dL (ref 30.0–36.0)
MCV: 95.2 fL (ref 80.0–100.0)
Platelets: 302 10*3/uL (ref 150–400)
RBC: 4.2 MIL/uL (ref 3.87–5.11)
RDW: 12.3 % (ref 11.5–15.5)
WBC: 7.9 10*3/uL (ref 4.0–10.5)
nRBC: 0 % (ref 0.0–0.2)

## 2019-11-05 NOTE — ED Triage Notes (Signed)
Pt sts she went to a friend's house to cut his hair today and she felt herself go into afib, and she also became flushed and nauseas. Pt took 50 mg metoprolol and feels like it has resolved. Pt's only complaint now is that her head feels weird. Pt not taking her prescribed blood thinner.

## 2019-11-06 ENCOUNTER — Encounter (HOSPITAL_COMMUNITY): Payer: Self-pay | Admitting: Neurology

## 2019-11-06 MED ORDER — ACETAMINOPHEN 500 MG PO TABS
1000.0000 mg | ORAL_TABLET | Freq: Once | ORAL | Status: AC
  Administered 2019-11-06: 1000 mg via ORAL
  Filled 2019-11-06: qty 2

## 2019-11-06 MED ORDER — RIVAROXABAN 15 MG PO TABS
15.0000 mg | ORAL_TABLET | Freq: Once | ORAL | Status: AC
  Administered 2019-11-06: 02:00:00 15 mg via ORAL
  Filled 2019-11-06: qty 1

## 2019-11-06 MED ORDER — RIVAROXABAN (XARELTO) EDUCATION KIT FOR AFIB PATIENTS
PACK | Freq: Once | Status: AC
  Administered 2019-11-06: 02:00:00
  Filled 2019-11-06: qty 1

## 2019-11-06 MED ORDER — RIVAROXABAN (XARELTO) VTE STARTER PACK (15 & 20 MG): ORAL_TABLET | ORAL | 0 refills | Status: DC

## 2019-11-06 NOTE — ED Notes (Signed)
Patient verbalizes understanding of discharge instructions. Opportunity for questioning and answers were provided. Armband removed by staff, pt discharged from ED ambulatory to home.  

## 2019-11-06 NOTE — ED Provider Notes (Signed)
Emergency Department Provider Note   I have reviewed the triage vital signs and the nursing notes.   HISTORY  Chief Complaint Atrial Fibrillation   HPI Mary Riley is a 73 y.o. female who presents to the emergency department today for multiple things.  Patient's has currently been worked up for TIA by her neurologist who told her she thought she had some clots that were being dislodged and her blood pressure was probably caused her to have a stroke that she had a stroke.  Patient had episode today of palpitations.  She supposed be on Xarelto but has never taken it.  She states she felt her heart rate was rapid and she got flushed.  She shortly after had elevated blood pressure her face turned red and she had a headache.  Her blood pressure was 170s who presented here for further evaluation.  No chest pain or shortness of breath.  No lower extremity swelling.  No neurologic changes.   No other associated or modifying symptoms.    Past Medical History:  Diagnosis Date  . Abdominal pain Nov. 2014  . Allergy   . Aneurysm of splenic artery [facility]) Nov 2014   Ref by Dr. Arta Silence  . Benign hematuria   . Chronic kidney disease    stage 3  . Dyslipidemia   . GERD (gastroesophageal reflux disease)   . History of Crohn's disease   . Hypertension   . Osteoporosis   . Paroxysmal atrial fibrillation [facility])     Patient Active Problem List   Diagnosis Date Noted  . CKD (chronic kidney disease) stage 2, GFR 60-89 ml/min 11/04/2019  . Amnestic state 11/04/2019  . Palpitations 03/11/2017  . AF (paroxysmal atrial fibrillation) ([place]) 03/11/2017  . HTN (hypertension), malignant 03/11/2017  . Atrial fibrillation with RVR ([place]) 02/02/2016  . Splenic artery aneurysm ([place]) 12/01/2013    Past Surgical History:  Procedure Laterality Date  . ABDOMINAL HYSTERECTOMY    . BILATERAL OOPHORECTOMY     d/t cyst  . BREAST BIOPSY     benign  . CATARACT EXTRACTION, BILATERAL    . COLONOSCOPY   2006 and 2011   Dr. Sammuel Cooper  . FOOT SURGERY Left 2012  . KNEE SURGERY Right Aug. 2006  . TUMOR EXCISION     from kidney    Current Outpatient Rx  . Order #: [id number] Class: Historical Med  . Order #: [id number] Class: Normal  . Order #: [id number] Class: Historical Med  . Order #: [id number] Class: Historical Med  . Order #: [id number] Class: Historical Med  . Order #: [id number] Class: Normal  . Order #: [id number] Class: Historical Med  . Order #: [id number] Class: Historical Med  . Order #: [id number] Class: Normal  . Order #: [id number] Class: Historical Med  . Order #: [id number] Class: Historical Med    Allergies Dilaudid [hydromorphone hcl], Ace inhibitors, Codeine, Crestor [rosuvastatin], Demerol [meperidine], Lipitor [atorvastatin], Zithromax [azithromycin], Biaxin [clarithromycin], and Sulfa antibiotics  Family History  Problem Relation Age of Onset  . Heart disease Mother 39       Heart Disease before age 61- Brain Aneurysm  . Hypertension Mother   . Aneurysm Mother        brain  . Heart disease Sister        Brain Aneurysm  . Aneurysm Sister        brain   . Cancer Maternal Grandmother        Colon  . Cancer Brother     Social History Social History   Tobacco Use  .  Smoking status: Never Smoker  . Smokeless tobacco: Never Used  Substance Use Topics  . Alcohol use: Yes    Alcohol/week: 1.0 standard drinks    Types: 1 Glasses of wine per week  . Drug use: No    Review of Systems  All other systems negative except as documented in the HPI. All pertinent positives and negatives as reviewed in the HPI. ____________________________________________   PHYSICAL EXAM:  VITAL SIGNS: ED Triage Vitals  Enc Vitals Group     BP 11/05/19 1750 (!) 162/80     Pulse Rate 11/05/19 1750 74     Resp 11/05/19 1750 16     Temp 11/05/19 1750 97.9 F (36.6 C)     Temp Source 11/05/19 1750 Oral     SpO2 11/05/19 1750 98 %    Constitutional: Alert and oriented. Well appearing  and in no acute distress. Eyes: Conjunctivae are normal. PERRL. EOMI. Head: Atraumatic. Nose: No congestion/rhinnorhea. Mouth/Throat: Mucous membranes are moist.  Oropharynx non-erythematous. Neck: No stridor.  No meningeal signs.   Cardiovascular: Normal rate, regular rhythm. Good peripheral circulation. Grossly normal heart sounds.   Respiratory: Normal respiratory effort.  No retractions. Lungs CTAB. Gastrointestinal: Soft and nontender. No distention.  Musculoskeletal: No lower extremity tenderness nor edema. No gross deformities of extremities. Neurologic:  No altered mental status, able to give full seemingly accurate history.  Face is symmetric, EOM's intact, pupils equal and reactive, vision intact, tongue and uvula midline without deviation. Upper and Lower extremity motor 5/5, intact pain perception in distal extremities, 2+ reflexes in biceps, patella and achilles tendons. Able to perform finger to nose normal with both hands. Walks without assistance or evident ataxia.  Skin:  Skin is warm, dry and intact. No rash noted.   ____________________________________________   LABS (all labs ordered are listed, but only abnormal results are displayed)  Labs Reviewed  BASIC METABOLIC PANEL - Abnormal; Notable for the following components:      Result Value   Glucose, Bld 126 (*)    Creatinine, Ser 1.18 (*)    GFR calc non Af Amer 45 (*)    GFR calc Af Amer 53 (*)    All other components within normal limits  CBC   ____________________________________________  EKG   EKG Interpretation  Date/Time:  Thursday November 05 2019 17:53:34 EST Ventricular Rate:  67 PR Interval:  158 QRS Duration: 80 QT Interval:  408 QTC Calculation: 431 R Axis:   58 Text Interpretation: Normal sinus rhythm Normal ECG improved TWI in III improved ST changes v5-6 Confirmed by Merrily Pew [phone]) on 11/05/2019 11:52:53 PM       ____________________________________________  RADIOLOGY  Dg  Chest 2 View  Result Date: 11/05/2019 CLINICAL DATA:  Atrial fibrillation.  Nausea. EXAM: CHEST - 2 VIEW COMPARISON:  02/02/2016 FINDINGS: The cardiac silhouette, mediastinal and hilar contours are within normal limits and stable. There is mild tortuosity and calcification of the thoracic aorta. The lungs are clear of an acute process. Stable mild hyperinflation and probable emphysematous changes. Stable moderate-sized hiatal hernia. No pleural effusions. No worrisome pulmonary lesions. The bony thorax is intact. IMPRESSION: No acute cardiopulmonary findings. Moderate-sized hiatal hernia. Electronically Signed   By: Marijo Sanes M.D.   On: 11/05/2019 18:49    ____________________________________________   PROCEDURES  Procedure(s) performed:   Procedures   ____________________________________________   INITIAL IMPRESSION / ASSESSMENT AND PLAN / ED COURSE  CHA2DS2/VAS Stroke Risk Points  Current as of 20 minutes ago  5 >= 2 Points: High Risk  1 - 1.99 Points: Medium Risk  0 Points: Low Risk    The patient's score has not changed in the past year.: No Change     Details    This score determines the patient's risk of having a stroke if the  patient has atrial fibrillation.       Points Metrics  0 Has Congestive Heart Failure:  No    Current as of 20 minutes ago  0 Has Vascular Disease:  No    Current as of 20 minutes ago  1 Has Hypertension:  Yes    Current as of 20 minutes ago  1 Age:  87    Current as of 20 minutes ago  0 Has Diabetes:  No    Current as of 20 minutes ago  2 Had Stroke:  No  Had TIA:  Yes  Had thromboembolism:  No    Current as of 20 minutes ago  1 Female:  Yes    Current as of 20 minutes ago       Started xarelto. Tylenol for headache. BP's improved when worry about stroke improved. Will continue checking BP's at home. Continue xarelto for known PAF and elevated chadsvasc.     Pertinent labs & imaging results that were available during my care  of the patient were reviewed by me and considered in my medical decision making (see chart for details).  A medical screening exam was performed and I feel the patient has had an appropriate workup for their chief complaint at this time and likelihood of emergent condition existing is low. They have been counseled on decision, discharge, follow up and which symptoms necessitate immediate return to the emergency department. They or their family verbally stated understanding and agreement with plan and discharged in stable condition.   ____________________________________________  FINAL CLINICAL IMPRESSION(S) / ED DIAGNOSES  Final diagnoses:  Palpitations  Hypertension, unspecified type     MEDICATIONS GIVEN DURING THIS VISIT:  Medications  acetaminophen (TYLENOL) tablet 1,000 mg (1,000 mg Oral Given 11/06/19 0131)  rivaroxaban [employer]) Education Kit for Afib patients ( Does not apply Given 11/06/19 0159)  Rivaroxaban (XARELTO) tablet 15 mg (15 mg Oral Given 11/06/19 0158)     NEW OUTPATIENT MEDICATIONS STARTED DURING THIS VISIT:  Discharge Medication List as of 11/06/2019  2:47 AM    START taking these medications   Details  Rivaroxaban 15 & 20 MG TBPK Follow package directions: Take one 2m tablet by mouth twice a day. On day 22, switch to one 239mtablet once a day. Take with food., Normal        Note:  This note was prepared with assistance of Dragon voice recognition software. Occasional wrong-word or sound-a-like substitutions may have occurred due to the inherent limitations of voice recognition software.   Fionn Stracke, JaCorene CorneaMD 11/06/19 [phone]

## 2019-11-06 NOTE — ED Notes (Signed)
ED Provider at bedside. 

## 2019-11-06 NOTE — ED Notes (Signed)
Family at bedside. 
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
## 2019-11-11 ENCOUNTER — Telehealth: Payer: Self-pay | Admitting: Neurology

## 2019-11-11 DIAGNOSIS — I10 Essential (primary) hypertension: Secondary | ICD-10-CM

## 2019-11-11 DIAGNOSIS — I4891 Unspecified atrial fibrillation: Secondary | ICD-10-CM

## 2019-11-11 DIAGNOSIS — N182 Chronic kidney disease, stage 2 (mild): Secondary | ICD-10-CM

## 2019-11-11 DIAGNOSIS — G459 Transient cerebral ischemic attack, unspecified: Secondary | ICD-10-CM

## 2019-11-11 DIAGNOSIS — R413 Other amnesia: Secondary | ICD-10-CM

## 2019-11-11 NOTE — Telephone Encounter (Signed)
Called and spoke to patient she is scheduled 11/20/2019 arrive at 11:00 [facility] office.  Can you re-order doppler [id number] location needs to be [facility]. Thanks Hinton Dyer

## 2019-11-11 NOTE — Telephone Encounter (Signed)
We can reorder this when Dr. Brett Fairy is back in the office on 11/18/2019 so the order can be in her name rather than the work-in MD.

## 2019-11-16 ENCOUNTER — Other Ambulatory Visit: Payer: Self-pay | Admitting: Neurology

## 2019-11-16 DIAGNOSIS — R413 Other amnesia: Secondary | ICD-10-CM

## 2019-11-16 DIAGNOSIS — G459 Transient cerebral ischemic attack, unspecified: Secondary | ICD-10-CM

## 2019-11-17 ENCOUNTER — Other Ambulatory Visit: Payer: Self-pay

## 2019-11-17 ENCOUNTER — Encounter: Payer: Self-pay | Admitting: Cardiovascular Disease

## 2019-11-17 ENCOUNTER — Ambulatory Visit: Payer: Medicare Other | Admitting: Cardiovascular Disease

## 2019-11-17 VITALS — BP 122/72 | HR 76 | Ht 63.0 in | Wt 147.0 lb

## 2019-11-17 DIAGNOSIS — G459 Transient cerebral ischemic attack, unspecified: Secondary | ICD-10-CM | POA: Diagnosis not present

## 2019-11-17 MED ORDER — RIVAROXABAN 20 MG PO TABS: 20.0000 mg | ORAL_TABLET | Freq: Every day | ORAL | 3 refills | Status: DC

## 2019-11-17 NOTE — Patient Instructions (Addendum)
Medication Instructions:  Your physician has recommended you make the following change in your medication:  1-TAKE Xarelto 20 mg by mouth daily.   *If you need a refill on your cardiac medications before your next appointment, please call your pharmacy*  Lab Work:  If you have labs (blood work) drawn today and your tests are completely normal, you will receive your results only by: [name] MyChart Message (if you have MyChart) OR . A paper copy in the mail If you have any lab test that is abnormal or we need to change your treatment, we will call you to review the results.  Testing/Procedures: None ordered today.  Follow-Up: At [facility], you and your health needs are our priority.  As part of our continuing mission to provide you with exceptional heart care, we have created designated Provider Care Teams.  These Care Teams include your primary Cardiologist (physician) and Advanced Practice Providers (APPs -  Physician Assistants and Nurse Practitioners) who all work together to provide you with the care you need, when you need it.  Your next appointment:   6  month(s)  The format for your next appointment:   In Person  Provider:   You may see Dr. Johnsie Cancel or one of the following Advanced Practice Providers on your designated Care Team:    Truitt Merle, NP  Cecilie Kicks, NP  Kathyrn Drown, NP

## 2019-11-18 ENCOUNTER — Other Ambulatory Visit: Payer: Self-pay | Admitting: Neurology

## 2019-11-18 NOTE — Telephone Encounter (Signed)
Please put the doppler VAS [id number] - order in and I can sign- CD See [facility] request.

## 2019-11-18 NOTE — Addendum Note (Signed)
Addended by: Larey Seat on: 11/18/2019 01:30 PM   Modules accepted: Orders

## 2019-11-20 ENCOUNTER — Other Ambulatory Visit: Payer: Self-pay

## 2019-11-20 ENCOUNTER — Ambulatory Visit (HOSPITAL_COMMUNITY)
Admission: RE | Admit: 2019-11-20 | Discharge: 2019-11-20 | Disposition: A | Discharge status: Home / Self Care | Payer: Medicare Other | Source: Ambulatory Visit | Attending: Cardiovascular Disease | Admitting: Cardiovascular Disease

## 2019-11-20 DIAGNOSIS — I10 Essential (primary) hypertension: Secondary | ICD-10-CM | POA: Diagnosis present

## 2019-11-20 DIAGNOSIS — R413 Other amnesia: Secondary | ICD-10-CM

## 2019-11-20 DIAGNOSIS — N182 Chronic kidney disease, stage 2 (mild): Secondary | ICD-10-CM | POA: Insufficient documentation

## 2019-11-20 DIAGNOSIS — G459 Transient cerebral ischemic attack, unspecified: Secondary | ICD-10-CM

## 2019-11-20 DIAGNOSIS — I4891 Unspecified atrial fibrillation: Secondary | ICD-10-CM | POA: Insufficient documentation

## 2019-11-23 ENCOUNTER — Encounter: Payer: Self-pay | Admitting: Neurology

## 2019-11-24 ENCOUNTER — Other Ambulatory Visit: Payer: Self-pay

## 2019-11-24 ENCOUNTER — Ambulatory Visit (HOSPITAL_COMMUNITY): Payer: Medicare Other | Attending: Cardiology

## 2019-11-24 DIAGNOSIS — R413 Other amnesia: Secondary | ICD-10-CM

## 2019-11-24 DIAGNOSIS — I4891 Unspecified atrial fibrillation: Secondary | ICD-10-CM | POA: Diagnosis present

## 2019-11-24 DIAGNOSIS — I10 Essential (primary) hypertension: Secondary | ICD-10-CM | POA: Diagnosis present

## 2019-11-24 DIAGNOSIS — G459 Transient cerebral ischemic attack, unspecified: Secondary | ICD-10-CM

## 2019-11-25 ENCOUNTER — Telehealth: Payer: Self-pay | Admitting: Neurology

## 2019-11-25 NOTE — Telephone Encounter (Signed)
Called the patient and reviewed the results with her. Pt verbalized understanding. Pt had no questions at this time but was encouraged to call back if questions arise.

## 2019-11-25 NOTE — Telephone Encounter (Signed)
-----   Message from Larey Seat, MD sent at 11/21/2019  5:03 PM EST ----- No sign of arteriosclerosis, stenosis in any brain perfusing arterial vessel.

## 2019-11-26 ENCOUNTER — Telehealth: Payer: Self-pay | Admitting: Neurology

## 2019-11-26 NOTE — Telephone Encounter (Signed)
-----   Message from Larey Seat, MD sent at 11/25/2019  3:41 PM EST ----- Great result - normal echocardiogram.

## 2019-11-26 NOTE — Telephone Encounter (Signed)
Called the patient and advised that the Echo was normal and there was nothing of concern. Pt verbalized understanding. Pt had no questions at this time but was encouraged to call back if questions arise.

## 2019-12-03 ENCOUNTER — Inpatient Hospital Stay: Admission: RE | Admit: 2019-12-03 | Payer: Medicare Other | Source: Ambulatory Visit

## 2019-12-16 ENCOUNTER — Ambulatory Visit: Payer: Medicare Other | Admitting: Adult Health
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
## 2020-01-11 ENCOUNTER — Encounter: Payer: Self-pay | Admitting: Adult Health

## 2020-01-11 ENCOUNTER — Ambulatory Visit: Payer: Medicare Other | Admitting: Adult Health

## 2020-01-11 ENCOUNTER — Other Ambulatory Visit: Payer: Self-pay

## 2020-01-11 VITALS — BP 132/72 | HR 70 | Temp 97.6°F | Ht 64.25 in | Wt 147.2 lb

## 2020-01-11 DIAGNOSIS — G441 Vascular headache, not elsewhere classified: Secondary | ICD-10-CM

## 2020-01-11 DIAGNOSIS — I4891 Unspecified atrial fibrillation: Secondary | ICD-10-CM | POA: Diagnosis not present

## 2020-01-11 DIAGNOSIS — I10 Essential (primary) hypertension: Secondary | ICD-10-CM

## 2020-01-11 DIAGNOSIS — H539 Unspecified visual disturbance: Secondary | ICD-10-CM | POA: Diagnosis not present

## 2020-01-11 DIAGNOSIS — E785 Hyperlipidemia, unspecified: Secondary | ICD-10-CM

## 2020-01-11 NOTE — Patient Instructions (Addendum)
Your Plan:   Recommend obtaining MRA head to look for any vessel narrowing or abnormalities possibly causing transient visual loss.  You will be called to schedule this imaging and please also ensure imaging for MRI is also scheduled at that time Continue Xarelto and pitavastatin for stroke prevention and continue to follow with cardiology/PCP for monitoring and management Please closely monitor your symptoms of double vision along with documenting headache symptoms after.  These appear to be likely visual aura migraines which can be worsened with changing of season, increased stress, lack of sleep and lack of adequate nutrition or exercise Please call office with any reoccurring visual loss or do not hesitate to call [phone]  Follow-up in 4 months or call earlier if needed      Thank you for coming to see [place] at [facility]. I hope we have been able to provide you high quality care today.  You may receive a patient satisfaction survey over the next few weeks. We would appreciate your feedback and comments so that we may continue to improve ourselves and the health of our patients.     Amaurosis Fugax Amaurosis fugax, also called transient visual loss, is a condition in which a person loses sight in one eye or, rarely, both eyes for a short time. The vision loss in the affected eye may be total or partial. The vision loss usually lasts for only a few seconds or minutes before sight returns to normal. In some cases, vision loss may last for several hours. This condition is typically caused by interruption of blood flow in the artery that supplies blood to the retina. The retina is the part of the eye that contains the nerves needed for sight. This condition can be a warning sign that a stroke may happen, either in the eye or in the brain. A stroke can result in permanent vision loss or loss of other body functions. What are the causes? This condition is caused by a loss or  interruption of blood flow to the retinal artery. Causes for the change in blood flow include:  A buildup of cholesterol and fats, or plaque, in the artery (atherosclerosis). If any plaque breaks off and gets into the bloodstream, it can travel to other blood vessels, such as the retinal artery.  Diseases of the heart valves.  Certain blood conditions, such as sickle cell anemia, leukemia, and blood clotting (coagulation) disorders.  Inflammation of the arteries (vasculitis).  A fast or irregular heartbeat, such as atrial fibrillation.  Family history of stroke. What increases the risk? The following factors may make you more likely to develop this condition:  Use of any tobacco products, including cigarettes, chewing tobacco, or electronic cigarettes.  Poorly controlled diabetes.  Conditions that can lead to diseases of the heart and blood vessels (cardiovascular diseases), such as: ? High blood pressure (hypertension). ? High cholesterol.  Drinking too much alcohol regularly.  Use of drugs, especially cocaine.  Age. The risk increases with age. What are the signs or symptoms? The main symptom of this condition is painless, sudden loss of vision in one eye. The vision loss often starts at the top and moves down, as if a curtain is being pulled down over your eye. This is usually followed by a quick return of vision. However, symptoms may last for several hours. It is important to seek medical care right away even if your symptoms go away. How is this diagnosed? This condition is diagnosed by:  Medical  history and physical exam.  Eye exam, including dilating drops and looking at the back of your eyes.  Carotid ultrasound. This checks for plaque in the carotid arteries in your neck.  Magnetic resonance angiography (MRA). This checks the carotid artery and the branches that supply the brain. MRA looks for areas of blockage or disease. You may also have other tests,  including:  Blood tests.  Electrocardiogram (ECG) to check your heart rhythm.  Echocardiogram (ECHO) to check your heart function. How is this treated? Emergency treatment for this condition may involve massaging the eyeball or using certain breathing techniques to remove or ease the blockage of the retinal artery. You may also get an emergency referral to a clinic or medical center that treats strokes. Other treatments focus on reducing your risk of having a stroke in the future. These may include:  Medicines, such as medicines to manage high blood pressure, diabetes, or cholesterol, or to thin the blood.  Surgical procedures, such as: ? Carotid endarterectomy to remove plaque from the carotid artery. ? Carotid angioplasty and placement of a small, mesh tube (stenting) to open the blocked part of the artery.  Lifestyle changes, such as stopping tobacco use, changing your diet, and getting enough exercise. Follow these instructions at home: Medicines  Take over-the-counter and prescription medicines only as told by your health care provider.  If you are taking blood thinners: ? Talk with your health care provider before you take any medicines that contain aspirin or NSAIDs, such as ibuprofen. These medicines increase your risk for dangerous bleeding. ? Take your medicine exactly as told, at the same time every day. ? Avoid activities that could cause injury or bruising, and follow instructions about how to prevent falls. ? Wear a medical alert bracelet or carry a card that lists what medicines you take. Eating and drinking   Eat a diet that includes five or more servings of fruits and vegetables each day. This may reduce the risk of stroke.  Certain diets may help with high blood pressure, high cholesterol, diabetes, or obesity. These include: ? A diet that is low in salt (sodium) to manage high blood pressure. ? A high-fiber diet that is low in saturated fat, trans fat, and  cholesterol to control cholesterol levels. ? A low-carbohydrate, low-sugar diet to manage diabetes. ? A reduced-calorie diet that is low in sodium, saturated fat, trans fat, and cholesterol to manage obesity. Lifestyle   Maintain a healthy weight.  Stay physically active. Try to get at least 30 minutes of activity on most or all days.  Do not use any products that contain nicotine or tobacco, such as cigarettes, e-cigarettes, and chewing tobacco. If you need help quitting, ask your health care provider.  Do not misuse drugs. General instructions  Do not drink alcohol if: ? Your health care provider tells you not to drink. ? You are pregnant, may be pregnant, or are planning to become pregnant.  If you drink alcohol: ? Limit how much you use to:  0-1 drink a day for women.  0-2 drinks a day for men. ? Be aware of how much alcohol is in your drink. In the [place] one drink equals one 12 oz bottle of beer (355 mL), one 5 oz glass of wine (148 mL), or one 1 oz glass of hard liquor (44 mL).  Keep all follow-up visits as told by your health care provider. This is important. Contact a health care provider if:  You lose vision in  one eye or both eyes. Get help right away if:   You have chest pain or an irregular heartbeat.  You have any symptoms of a stroke. "BE FAST" is an easy way to remember the main warning signs of a stroke. ? B - Balance. Signs are dizziness, sudden trouble walking, or loss of balance. ? E - Eyes. Signs are trouble seeing or a sudden change in vision. ? F - Face. Signs are sudden weakness or numbness of the face, or the face or eyelid drooping on one side. ? A - Arms. Signs are weakness or numbness in an arm. This happens suddenly and usually on one side of the body. ? S - Speech. Signs are sudden trouble speaking, slurred speech, or trouble understanding what people say. ? T - Time. Time to call emergency services. Write down what time symptoms started.  You  have other signs of a stroke, such as: ? A sudden, severe headache with no known cause. ? Nausea or vomiting. ? Seizure. These symptoms may represent a serious problem that is an emergency. Do not wait to see if the symptoms will go away. Get medical help right away. Call your local emergency services (911 in the [place] Do not drive yourself to the hospital. Summary  Amaurosis fugax is a condition in which you lose sight for a short time.  This condition can be a warning sign that a stroke may happen. A stroke can result in permanent vision loss or loss of other body functions.  Seek medical care right away even if your symptoms go away. This information is not intended to replace advice given to you by your health care provider. Make sure you discuss any questions you have with your health care provider. Document Revised: 06/17/2019 Document Reviewed: 06/17/2019 Elsevier Patient Education  [place].
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
## 2020-02-08 ENCOUNTER — Other Ambulatory Visit: Payer: Medicare Other

## 2020-02-10 ENCOUNTER — Other Ambulatory Visit: Payer: Medicare Other

## 2020-02-10 ENCOUNTER — Ambulatory Visit
Admission: RE | Admit: 2020-02-10 | Discharge: 2020-02-10 | Disposition: A | Discharge status: Home / Self Care | Payer: Medicare Other | Source: Ambulatory Visit | Attending: Adult Health | Admitting: Adult Health

## 2020-02-10 NOTE — Progress Notes (Signed)
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 2. No intracranial large vessel occlusion or proximal high-grade arterial stenosis.   Electronically Signed   By: Kellie Simmering DO   On: 02/10/2020 16:05  4 mm aneurysm needs to be monitored by regular MRA or CTA. I prefer to refer to Dr Estanislado Pandy , neuroradiology , for a possible intervention.

## 2020-02-17 ENCOUNTER — Other Ambulatory Visit (HOSPITAL_COMMUNITY): Payer: Self-pay | Admitting: Interventional Radiology

## 2020-02-17 ENCOUNTER — Telehealth: Payer: Self-pay | Admitting: Neurology

## 2020-02-17 DIAGNOSIS — I671 Cerebral aneurysm, nonruptured: Secondary | ICD-10-CM

## 2020-02-17 DIAGNOSIS — G459 Transient cerebral ischemic attack, unspecified: Secondary | ICD-10-CM

## 2020-02-17 DIAGNOSIS — I4891 Unspecified atrial fibrillation: Secondary | ICD-10-CM

## 2020-02-17 DIAGNOSIS — I10 Essential (primary) hypertension: Secondary | ICD-10-CM

## 2020-02-17 NOTE — Telephone Encounter (Signed)
-----   Message from Larey Seat, MD sent at 02/10/2020  4:43 PM EST ----- 4 mm medially projecting paraophthalmic right internal carotid artery saccular aneurysm. 2. No intracranial large vessel occlusion or proximal high-grade arterial stenosis.   Electronically Signed   By: Kellie Simmering DO   On: 02/10/2020 16:05  4 mm aneurysm needs to be monitored by regular MRA or CTA. I prefer to refer to Dr Estanislado Pandy , neuroradiology , for a possible intervention.

## 2020-02-17 NOTE — Telephone Encounter (Signed)
Called the patient to inform her of the results of the MRA of the head. Advised of the aneurysm finding and patient had not known this was here. I was going to educate on this but patient advised she is very much aware, her sister and mother died from that. Advised our plan would be to send this information as a referral to the interventional radiologist Dr Estanislado Pandy and have them be sure to complete frequent monitoring on this finding. She was appreciative and will be looking for their call.

## 2020-02-18 ENCOUNTER — Other Ambulatory Visit: Payer: Self-pay

## 2020-02-18 ENCOUNTER — Ambulatory Visit (HOSPITAL_COMMUNITY)
Admission: RE | Admit: 2020-02-18 | Discharge: 2020-02-18 | Disposition: A | Discharge status: Home / Self Care | Payer: Medicare Other | Source: Ambulatory Visit | Attending: Interventional Radiology | Admitting: Interventional Radiology

## 2020-02-18 DIAGNOSIS — I671 Cerebral aneurysm, nonruptured: Secondary | ICD-10-CM
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
## 2020-04-06 ENCOUNTER — Telehealth (HOSPITAL_COMMUNITY): Payer: Self-pay | Admitting: Radiology

## 2020-04-06 NOTE — Telephone Encounter (Signed)
Called pt to follow-up with her concerning her aneurysm and possible tx. At this time the pt does NOT wish to pursue any type of treatment for her brain aneurysm. Should she change her mind she will call us back. JM

## 2020-04-19 ENCOUNTER — Other Ambulatory Visit: Payer: Self-pay | Admitting: Family Medicine

## 2020-04-19 DIAGNOSIS — I728 Aneurysm of other specified arteries: Secondary | ICD-10-CM

## 2020-04-20 ENCOUNTER — Other Ambulatory Visit: Payer: Self-pay | Admitting: Family Medicine

## 2020-05-12 ENCOUNTER — Telehealth: Payer: Self-pay | Admitting: *Deleted

## 2020-05-12 NOTE — Telephone Encounter (Signed)
Patient with diagnosis of afib on Xarelto for anticoagulation.    Procedure: Colonoscopy Date of procedure: TBD  CHADS2-VASc score of  3 (HTN, AGE, female)  CrCl 36 ml/min  Per office protocol, patient can hold Xarelto for 2 days prior to procedure.

## 2020-05-12 NOTE — Telephone Encounter (Signed)
   Primary Cardiologist: No primary care provider on file.  Chart reviewed as part of pre-operative protocol coverage. Given past medical history and time since last visit, based on ACC/AHA guidelines, Mary Riley would be at acceptable risk for the planned procedure without further cardiovascular testing.   Patient with diagnosis of afib on Xarelto for anticoagulation.    Procedure: Colonoscopy Date of procedure: TBD  CHADS2-VASc score of  3 (HTN, AGE, female)  CrCl 36 ml/min  Per office protocol, patient can hold Xarelto for 2 days prior to procedure.  I will route this recommendation to the requesting party via Epic fax function and remove from pre-op pool.  Please call with questions.  Jossie Ng. Kearia Yin NP-C    05/12/2020, 3:06 PM [address] Office [phone] Fax [phone]

## 2020-05-12 NOTE — Telephone Encounter (Signed)
   [facility] Pre-operative Risk Assessment    HEARTCARE STAFF: - Please ensure there is not already an duplicate clearance open for this procedure. - Under Visit Info/Reason for Call, type in Other and utilize the format Clearance MM/DD/YY or Clearance TBD. Do not use dashes or single digits. - If request is for dental extraction, please clarify the # of teeth to be extracted.  Request for surgical clearance:  1. What type of surgery is being performed? COLONOSCOPY   2. When is this surgery scheduled? TBD   3. What type of clearance is required (medical clearance vs. Pharmacy clearance to hold med vs. Both)? BOTH  4. Are there any medications that need to be held prior to surgery and how long? XARELTO x 2 DAYS PRIOR TO PROCEDURE  5. Practice name and name of physician performing surgery? [facility]; DR. Paulita Fujita  6. What is the office phone number? [phone]   7.   What is the office fax number? [phone]  8.   Anesthesia type (None, local, MAC, general) ? PROPOFOL?    Julaine Hua 05/12/2020, 9:58 AM  _________________________________________________________________   (provider comments below)

## 2020-05-17 ENCOUNTER — Encounter: Payer: Self-pay | Admitting: Adult Health

## 2020-05-17 ENCOUNTER — Ambulatory Visit: Payer: Medicare Other | Admitting: Adult Health
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
## 2020-05-30 ENCOUNTER — Telehealth: Payer: Self-pay | Admitting: Cardiovascular Disease

## 2020-05-30 NOTE — Telephone Encounter (Signed)
Follow Up:     April from Dr Erlinda Hong Office called. She is checking on the status of pt's clearance. Please fax asap please to [phone]

## 2020-05-30 NOTE — Telephone Encounter (Signed)
° °  Primary Cardiologist: Jenkins Rouge, MD  Chart reviewed as part of pre-operative protocol coverage. Given past medical history and time since last visit, based on ACC/AHA guidelines, Nahjae Rotenberg would be at acceptable risk for the planned procedure without further cardiovascular testing.   Patient with diagnosis ofafibon Xareltofor anticoagulation.   Procedure:Colonoscopy Date of procedure:TBD  CHADS2-VASc score of3(HTN, AGE,female)  CrCl36 ml/min  Per office protocol, patient can holdXareltofor 2days prior to procedure.  I will route this recommendation to the requesting party via Epic fax function and remove from pre-op pool.  Please call with questions.  Jossie Ng. Marticia Reifschneider NP-C    05/30/2020, 10:32 AM [place] [address] Office [phone] Fax [phone]
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
## 2020-07-15 ENCOUNTER — Ambulatory Visit: Payer: Medicare Other | Admitting: Cardiovascular Disease

## 2020-07-15 ENCOUNTER — Other Ambulatory Visit: Payer: Self-pay

## 2020-07-15 ENCOUNTER — Encounter: Payer: Self-pay | Admitting: Cardiovascular Disease

## 2020-07-15 VITALS — BP 110/70 | HR 65 | Ht 64.0 in | Wt 145.0 lb

## 2020-07-15 DIAGNOSIS — I480 Paroxysmal atrial fibrillation: Secondary | ICD-10-CM | POA: Diagnosis not present

## 2020-07-15 DIAGNOSIS — I10 Essential (primary) hypertension: Secondary | ICD-10-CM | POA: Diagnosis not present

## 2020-07-15 NOTE — Patient Instructions (Signed)

## 2020-10-07 ENCOUNTER — Emergency Department (HOSPITAL_COMMUNITY)
Admission: EM | Admit: 2020-10-07 | Discharge: 2020-10-07 | Disposition: A | Discharge status: Home / Self Care | Payer: Medicare Other | Attending: Emergency Medicine | Admitting: Emergency Medicine

## 2020-10-07 ENCOUNTER — Telehealth: Payer: Self-pay | Admitting: Cardiovascular Disease

## 2020-10-07 ENCOUNTER — Emergency Department (HOSPITAL_COMMUNITY): Payer: Medicare Other

## 2020-10-07 ENCOUNTER — Encounter (HOSPITAL_COMMUNITY): Payer: Self-pay | Admitting: Emergency Medicine

## 2020-10-07 ENCOUNTER — Other Ambulatory Visit: Payer: Self-pay

## 2020-10-07 DIAGNOSIS — E860 Dehydration: Secondary | ICD-10-CM | POA: Diagnosis not present

## 2020-10-07 DIAGNOSIS — I129 Hypertensive chronic kidney disease with stage 1 through stage 4 chronic kidney disease, or unspecified chronic kidney disease: Secondary | ICD-10-CM | POA: Insufficient documentation

## 2020-10-07 DIAGNOSIS — Z79899 Other long term (current) drug therapy: Secondary | ICD-10-CM | POA: Insufficient documentation

## 2020-10-07 DIAGNOSIS — R000 Tachycardia, unspecified: Secondary | ICD-10-CM | POA: Diagnosis not present

## 2020-10-07 DIAGNOSIS — R002 Palpitations: Secondary | ICD-10-CM | POA: Insufficient documentation

## 2020-10-07 DIAGNOSIS — N183 Chronic kidney disease, stage 3 unspecified: Secondary | ICD-10-CM | POA: Insufficient documentation

## 2020-10-07 DIAGNOSIS — R252 Cramp and spasm: Secondary | ICD-10-CM | POA: Diagnosis not present

## 2020-10-07 DIAGNOSIS — Z7901 Long term (current) use of anticoagulants: Secondary | ICD-10-CM | POA: Insufficient documentation

## 2020-10-07 DIAGNOSIS — E876 Hypokalemia: Secondary | ICD-10-CM | POA: Insufficient documentation

## 2020-10-07 LAB — CBC WITH DIFFERENTIAL/PLATELET
Abs Immature Granulocytes: 0.03 10*3/uL (ref 0.00–0.07)
Basophils Absolute: 0.1 10*3/uL (ref 0.0–0.1)
Basophils Relative: 1 %
Eosinophils Absolute: 0.1 10*3/uL (ref 0.0–0.5)
Eosinophils Relative: 1 %
HCT: 42.4 % (ref 36.0–46.0)
Hemoglobin: 13.9 g/dL (ref 12.0–15.0)
Immature Granulocytes: 0 %
Lymphocytes Relative: 17 %
Lymphs Abs: 1.6 10*3/uL (ref 0.7–4.0)
MCH: 30.4 pg (ref 26.0–34.0)
MCHC: 32.8 g/dL (ref 30.0–36.0)
MCV: 92.8 fL (ref 80.0–100.0)
Monocytes Absolute: 0.7 10*3/uL (ref 0.1–1.0)
Monocytes Relative: 7 %
Neutro Abs: 7.1 10*3/uL (ref 1.7–7.7)
Neutrophils Relative %: 74 %
Platelets: 296 10*3/uL (ref 150–400)
RBC: 4.57 MIL/uL (ref 3.87–5.11)
RDW: 12.3 % (ref 11.5–15.5)
WBC: 9.6 10*3/uL (ref 4.0–10.5)
nRBC: 0 % (ref 0.0–0.2)

## 2020-10-07 LAB — COMPREHENSIVE METABOLIC PANEL
ALT: 16 U/L (ref 0–44)
AST: 21 U/L (ref 15–41)
Albumin: 4.5 g/dL (ref 3.5–5.0)
Alkaline Phosphatase: 60 U/L (ref 38–126)
Anion gap: 13 (ref 5–15)
BUN: 21 mg/dL (ref 8–23)
CO2: 26 mmol/L (ref 22–32)
Calcium: 10 mg/dL (ref 8.9–10.3)
Chloride: 99 mmol/L (ref 98–111)
Creatinine, Ser: 1.37 mg/dL — ABNORMAL HIGH (ref 0.44–1.00)
GFR, Estimated: 40 mL/min — ABNORMAL LOW (ref >60)
Glucose, Bld: 107 mg/dL — ABNORMAL HIGH (ref 70–99)
Potassium: 3.4 mmol/L — ABNORMAL LOW (ref 3.5–5.1)
Sodium: 138 mmol/L (ref 135–145)
Total Bilirubin: 0.7 mg/dL (ref 0.3–1.2)
Total Protein: 7.8 g/dL (ref 6.5–8.1)

## 2020-10-07 LAB — URINALYSIS, ROUTINE W REFLEX MICROSCOPIC
Bilirubin Urine: NEGATIVE
Glucose, UA: NEGATIVE mg/dL
Hgb urine dipstick: NEGATIVE
Ketones, ur: NEGATIVE mg/dL
Leukocytes,Ua: NEGATIVE
Nitrite: NEGATIVE
Protein, ur: NEGATIVE mg/dL
Specific Gravity, Urine: 1.004 — ABNORMAL LOW (ref 1.005–1.030)
pH: 7 (ref 5.0–8.0)

## 2020-10-07 LAB — T4, FREE: Free T4: 0.94 ng/dL (ref 0.61–1.12)

## 2020-10-07 LAB — TSH: TSH: 2.216 u[IU]/mL (ref 0.350–4.500)

## 2020-10-07 MED ORDER — SODIUM CHLORIDE 0.9 % IV BOLUS
500.0000 mL | Freq: Once | INTRAVENOUS | Status: AC
  Administered 2020-10-07: 500 mL via INTRAVENOUS

## 2020-10-07 MED ORDER — SODIUM CHLORIDE 0.9 % IV BOLUS
1000.0000 mL | Freq: Once | INTRAVENOUS | Status: AC
  Administered 2020-10-07: 1000 mL via INTRAVENOUS

## 2020-10-07 MED ORDER — ACETAMINOPHEN 500 MG PO TABS
1000.0000 mg | ORAL_TABLET | Freq: Once | ORAL | Status: AC
  Administered 2020-10-07: 1000 mg via ORAL
  Filled 2020-10-07: qty 2

## 2020-10-07 MED ORDER — POTASSIUM CHLORIDE CRYS ER 20 MEQ PO TBCR
40.0000 meq | EXTENDED_RELEASE_TABLET | Freq: Once | ORAL | Status: AC
  Administered 2020-10-07: 40 meq via ORAL
  Filled 2020-10-07: qty 2

## 2020-10-07 NOTE — ED Provider Notes (Signed)
[facility]ERGENCY DEPARTMENT Provider Note   CSN: [id number] Arrival date & time: 10/07/20  1613     History No chief complaint on file.   Mary Riley is a 74 y.o. female.  59 yoF with a chief complaint of frequent urination.  This been going on since yesterday afternoon.  Patient states that about every 10 or 15 minutes she ends up need to go to the restroom again.  She denies any dysuria denies flank pain denies fevers.  She also has noted that her heart rate has been racing on her.  She took 2 extra doses of metoprolol on an extra dose of amlodipine earlier today.  She had called her cardiologist who suggested she come to the ED for evaluation.  She also had noted that she was hypertensive at home into the 140s.  Heart rate still in the 120s after 2 doses of metoprolol.  She denies chest pain or pressure denies shortness of breath.  She does have a very mild headache.  Denies one-sided numbness or weakness denies difficulty with speech or swallowing.  Denies head injury.  The history is provided by the patient.  Illness Severity:  Moderate Onset quality:  Gradual Duration:  10 minutes Timing:  Constant Progression:  Worsening Chronicity:  New Associated symptoms: no chest pain, no congestion, no fever, no headaches, no myalgias, no nausea, no rhinorrhea, no shortness of breath, no vomiting and no wheezing        Past Medical History:  Diagnosis Date  . Abdominal pain Nov. 2014  . Allergy   . Aneurysm of splenic artery [facility]) Nov 2014   Ref by Dr. Arta Silence  . Benign hematuria   . Chronic kidney disease    stage 3  . Dyslipidemia   . GERD (gastroesophageal reflux disease)   . History of Crohn's disease   . Hypertension   . Osteoporosis   . Paroxysmal atrial fibrillation [facility])     Patient Active Problem List   Diagnosis Date Noted  . CKD (chronic kidney disease) stage 2, GFR 60-89 ml/min 11/04/2019  . Amnestic state 11/04/2019  .  Palpitations 03/11/2017  . AF (paroxysmal atrial fibrillation) ([place]) 03/11/2017  . HTN (hypertension), malignant 03/11/2017  . Atrial fibrillation with RVR ([place]) 02/02/2016  . Splenic artery aneurysm ([place]) 12/01/2013    Past Surgical History:  Procedure Laterality Date  . ABDOMINAL HYSTERECTOMY    . BILATERAL OOPHORECTOMY     d/t cyst  . BREAST BIOPSY     benign  . CATARACT EXTRACTION, BILATERAL    . COLONOSCOPY  2006 and 2011   Dr. Sammuel Cooper  . FOOT SURGERY Left 2012  . KNEE SURGERY Right Aug. 2006  . TUMOR EXCISION     from kidney     OB History   No obstetric history on file.     Family History  Problem Relation Age of Onset  . Heart disease Mother 73       Heart Disease before age 8- Brain Aneurysm  . Hypertension Mother   . Aneurysm Mother        brain  . Heart disease Sister        Brain Aneurysm  . Aneurysm Sister        brain   . Cancer Maternal Grandmother        Colon  . Cancer Brother     Social History   Tobacco Use  . Smoking status: Never Smoker  . Smokeless tobacco: Never  Used  Substance Use Topics  . Alcohol use: Yes    Alcohol/week: 1.0 standard drink    Types: 1 Glasses of wine per week  . Drug use: No    Home Medications Prior to Admission medications   Medication Sig Start Date End Date Taking? Authorizing Provider  albuterol (VENTOLIN HFA) 108 (90 Base) MCG/ACT inhaler Inhale 2 puffs into the lungs every 6 (six) hours as needed for wheezing or shortness of breath.    [provider]  amLODipine (NORVASC) 5 MG tablet Take 1 tablet (5 mg total) by mouth daily. 02/08/17   Josue Hector, MD  Lactobacillus Rhamnosus, GG, (CULTURELLE PO) Take 1 capsule by mouth every morning.    [provider]  LIVALO 1 MG TABS Take 1 mg by mouth 3 (three) times a week.  01/24/16   [provider]  metoprolol tartrate (LOPRESSOR) 50 MG tablet TAKE 1 TABLET(50 MG) BY MOUTH TWICE DAILY 02/06/19   Josue Hector, MD  omeprazole  (PRILOSEC) 40 MG capsule Take 40 mg by mouth daily. 09/10/19   [provider]  pantoprazole (PROTONIX) 40 MG tablet Take 40 mg by mouth daily. 03/19/19   [provider]  rivaroxaban (XARELTO) 20 MG TABS tablet Take 1 tablet (20 mg total) by mouth daily with supper. 11/17/19   Josue Hector, MD  Telmisartan-amLODIPine 40-5 MG TABS Take 1 tablet by mouth daily. 09/10/19   [provider]    Allergies    Dilaudid [hydromorphone hcl], Ace inhibitors, Codeine, Crestor [rosuvastatin], Demerol [meperidine], Lipitor [atorvastatin], Zithromax [azithromycin], Biaxin [clarithromycin], and Sulfa antibiotics  Review of Systems   Review of Systems  Constitutional: Negative for chills and fever.  HENT: Negative for congestion and rhinorrhea.   Eyes: Negative for redness and visual disturbance.  Respiratory: Negative for shortness of breath and wheezing.   Cardiovascular: Positive for palpitations. Negative for chest pain.  Gastrointestinal: Negative for nausea and vomiting.  Genitourinary: Negative for dysuria and urgency.  Musculoskeletal: Negative for arthralgias and myalgias.  Skin: Negative for pallor and wound.  Neurological: Negative for dizziness and headaches.    Physical Exam Updated Vital Signs BP 136/67   Pulse 99   Temp 98.2 F (36.8 C) (Oral)   Resp 12   Ht 5\' 3"  (1.6 m)   Wt 65.3 kg   SpO2 98%   BMI 25.51 kg/m   Physical Exam Vitals and nursing note reviewed.  Constitutional:      General: She is not in acute distress.    Appearance: She is well-developed. She is not diaphoretic.  HENT:     Head: Normocephalic and atraumatic.  Eyes:     Pupils: Pupils are equal, round, and reactive to light.  Cardiovascular:     Rate and Rhythm: Regular rhythm. Tachycardia present.     Heart sounds: No murmur heard.  No friction rub. No gallop.   Pulmonary:     Effort: Pulmonary effort is normal.     Breath sounds: No wheezing or rales.  Abdominal:      General: There is no distension.     Palpations: Abdomen is soft.     Tenderness: There is no abdominal tenderness.  Musculoskeletal:        General: No tenderness.     Cervical back: Normal range of motion and neck supple.  Skin:    General: Skin is warm and dry.  Neurological:     Mental Status: She is alert and oriented to person, place, and  time.  Psychiatric:        Behavior: Behavior normal.     ED Results / Procedures / Treatments   Labs (all labs ordered are listed, but only abnormal results are displayed) Labs Reviewed  COMPREHENSIVE METABOLIC PANEL - Abnormal; Notable for the following components:      Result Value   Potassium 3.4 (*)    Glucose, Bld 107 (*)    Creatinine, Ser 1.37 (*)    GFR, Estimated 40 (*)    All other components within normal limits  URINALYSIS, ROUTINE W REFLEX MICROSCOPIC - Abnormal; Notable for the following components:   Color, Urine STRAW (*)    Specific Gravity, Urine 1.004 (*)    All other components within normal limits  CBC WITH DIFFERENTIAL/PLATELET  TSH  T4, FREE    EKG EKG Interpretation  Date/Time:  Friday October 07 2020 16:19:12 EDT Ventricular Rate:  110 PR Interval:    QRS Duration: 98 QT Interval:  348 QTC Calculation: 471 R Axis:   65 Text Interpretation: Sinus tachycardia Right atrial enlargement Consider right ventricular hypertrophy Borderline T abnormalities, anterior leads Otherwise no significant change Confirmed by Deno Etienne [phone]) on 10/07/2020 4:56:19 PM   Radiology DG Chest Port 1 View  Result Date: 10/07/2020 CLINICAL DATA:  Tachycardia. EXAM: PORTABLE CHEST 1 VIEW COMPARISON:  November 05, 2019 FINDINGS: The lungs are hyperinflated. There is no evidence of acute infiltrate, pleural effusion or pneumothorax. The heart size and mediastinal contours are within normal limits. A small to moderate size hiatal hernia is seen. The visualized skeletal structures are unremarkable. IMPRESSION: 1. No active  cardiopulmonary disease. 2. Small to moderate size hiatal hernia. Electronically Signed   By: Virgina Norfolk M.D.   On: 10/07/2020 16:55    Procedures Procedures (including critical care time)  Medications Ordered in ED Medications  sodium chloride 0.9 % bolus 1,000 mL (0 mLs Intravenous Stopped 10/07/20 1914)  acetaminophen (TYLENOL) tablet 1,000 mg (1,000 mg Oral Given 10/07/20 1728)  sodium chloride 0.9 % bolus 500 mL (500 mLs Intravenous New Bag/Given 10/07/20 1952)  potassium chloride SA (KLOR-CON) CR tablet 40 mEq (40 mEq Oral Given 10/07/20 1953)    ED Course  I have reviewed the triage vital signs and the nursing notes.  Pertinent labs & imaging results that were available during my care of the patient were reviewed by me and considered in my medical decision making (see chart for details).    MDM Rules/Calculators/A&P                          74 yo F with a cc of palpitations.  Noted earlier today.  Having frequent urination since yesterday and leg cramps.  Took doses of her hypertension and rate controlling medication at home without improvement.  Called her cardiologist who suggested to come to the ED.  Endorsed taking Benadryl over the past couple days due to exposure to allergens.  Question anticholinergic symptoms.  Will obtain a laboratory evaluation bolus IV fluids chest x-ray UA reassess.  Very mild hypokalemia.  Sodium levels normal.  Given a bolus of IV fluids with resolution of her tachycardia.  Cramping is also improved significantly.  Headache is also resolved.  Given another 500 cc of fluid with resolution of cramping.  I discussed results with the patient.  UA is negative for infection.  I suppose this was likely dehydration.  I discussed stopping taking Benadryl.  8:36 PM:  I have discussed  the diagnosis/risks/treatment options with the patient and believe the pt to be eligible for discharge home to follow-up with PCP. We also discussed returning to the ED  immediately if new or worsening sx occur. We discussed the sx which are most concerning (e.g., sudden worsening pain, fever, inability to tolerate by mouth) that necessitate immediate return. Medications administered to the patient during their visit and any new prescriptions provided to the patient are listed below.  Medications given during this visit Medications  sodium chloride 0.9 % bolus 1,000 mL (0 mLs Intravenous Stopped 10/07/20 1914)  acetaminophen (TYLENOL) tablet 1,000 mg (1,000 mg Oral Given 10/07/20 1728)  sodium chloride 0.9 % bolus 500 mL (500 mLs Intravenous New Bag/Given 10/07/20 1952)  potassium chloride SA (KLOR-CON) CR tablet 40 mEq (40 mEq Oral Given 10/07/20 1953)     The patient appears reasonably screen and/or stabilized for discharge and I doubt any other medical condition or other [facility] requiring further screening, evaluation, or treatment in the ED at this time prior to discharge.   Final Clinical Impression(s) / ED Diagnoses Final diagnoses:  Sinus tachycardia  Cramps, extremity  Palpitations  Hypokalemia  Dehydration    Rx / DC Orders ED Discharge Orders    None       Deno Etienne, DO 10/07/20 2036

## 2020-10-07 NOTE — Discharge Instructions (Signed)
Eat and drink as well as you can for the next 48 hours.  Please return for worsening cramping or persistence heart racing or if you feel you are in a pass out.  Also return for chest pain or trouble breathing.  Worsening headache.

## 2020-10-07 NOTE — ED Triage Notes (Addendum)
BIB by EMS for complaint of dehyration, palpitations, and leg cramps.  Pt awoke with these symptoms around 6am.  Self-tx with OTC supplements (mustard seed, Potassium, and MagL.  Pt also took 2 Metoprolol and an amlodipine.  Stated she had 4watery BM and voided approx. Every 15 min today.  Eventually called her Doc who told her to come to the hospital.  Pt has hx of afib and some recent anxiety r/t to the 1 yr anniversary of her husbands death.  EMS reports orthostatic changes. Pt complains of a headache but no other pain.

## 2020-10-07 NOTE — Telephone Encounter (Signed)
STAT if HR is under 50 or over 120 (normal HR is 60-100 beats per minute)  1) What is your heart rate? 122  2) Do you have a log of your heart rate readings (document readings)?   3) Do you have any other symptoms? Urinating a lot

## 2020-10-07 NOTE — Telephone Encounter (Signed)
Spoke with the patient who states that she woke up this morning with cramps in her legs and feet. She took mustard seed and went back to sleep. She woke back up still having cramps and stated she was in Afib. She states BP was 141/98 and HR 122.  She states she has taken 2 metoprolol tartrate 50 mg tablets. She complains of cramping, shaking and frequent urination. She states that she is a little bit dizzy, denies SOB or chest pain. She states that she has not taken Xarelto in about a month due to the cost.  I spoke with Dr. Johnsie Cancel who advised that the patient can take an additional metoprolol 50 mg this evening but should go to an urgent care or the ED for further evaluation due to concern for an infection and not being anti-coagulated.  Advised patient of recommendations per Dr. Johnsie Cancel. Patient said that she would think about it. I again advised her on the importance of being evaluated. Patient verbalized understanding.

## 2020-10-07 NOTE — ED Notes (Signed)
ED Provider at bedside. 
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
## 2021-01-04 ENCOUNTER — Other Ambulatory Visit: Payer: Self-pay | Admitting: Family Medicine

## 2021-01-04 DIAGNOSIS — I671 Cerebral aneurysm, nonruptured: Secondary | ICD-10-CM
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
## 2021-01-12 ENCOUNTER — Ambulatory Visit: Payer: Medicare Other | Admitting: Cardiovascular Disease

## 2021-01-16 ENCOUNTER — Other Ambulatory Visit: Payer: Self-pay

## 2021-01-16 ENCOUNTER — Ambulatory Visit: Payer: Medicare Other

## 2021-01-16 ENCOUNTER — Telehealth (INDEPENDENT_AMBULATORY_CARE_PROVIDER_SITE_OTHER): Payer: Medicare Other | Admitting: Cardiovascular Disease

## 2021-01-16 VITALS — BP 133/74 | HR 84 | Ht 63.0 in | Wt 147.0 lb

## 2021-01-16 DIAGNOSIS — Z7901 Long term (current) use of anticoagulants: Secondary | ICD-10-CM

## 2021-01-16 DIAGNOSIS — I480 Paroxysmal atrial fibrillation: Secondary | ICD-10-CM | POA: Diagnosis not present

## 2021-01-16 NOTE — Patient Instructions (Signed)

## 2021-01-23 ENCOUNTER — Other Ambulatory Visit: Payer: Medicare Other

## 2021-01-31 ENCOUNTER — Ambulatory Visit
Admission: RE | Admit: 2021-01-31 | Discharge: 2021-01-31 | Disposition: A | Discharge status: Home / Self Care | Payer: Medicare Other | Source: Ambulatory Visit | Attending: Family Medicine | Admitting: Family Medicine

## 2021-01-31 ENCOUNTER — Other Ambulatory Visit: Payer: Self-pay

## 2021-01-31 DIAGNOSIS — I671 Cerebral aneurysm, nonruptured: Secondary | ICD-10-CM

## 2021-04-14 ENCOUNTER — Other Ambulatory Visit: Payer: Self-pay | Admitting: Cardiovascular Disease

## 2021-04-14 NOTE — Telephone Encounter (Signed)
Spoke with Melissa pharm D. Will keep pt on current dose of Xarelto 20 mg daily and will send in for a 3 month supply. Pt has appointment recall to see Dr in July.  Put on the July recall appointment note for pt have CBC and Bmet and updated weight for Xarelto follow up.

## 2021-04-14 NOTE — Telephone Encounter (Signed)
Prescription refill request for Xarelto received.  Indication: afib  Last office visit: Nishan, 01/16/2021 Weight: 66.7 kg  Age: 75 yo  Scr: 1.03, 10/18/2020 via [facility] CrCl: 49.69 ml/min   Per dosing criteria, pt qualifies for a dose decrease of Xarelto 15mg  daily.

## 2021-05-02 DIAGNOSIS — I728 Aneurysm of other specified arteries: Secondary | ICD-10-CM | POA: Diagnosis not present

## 2021-05-02 DIAGNOSIS — I480 Paroxysmal atrial fibrillation: Secondary | ICD-10-CM | POA: Diagnosis not present

## 2021-05-02 DIAGNOSIS — I129 Hypertensive chronic kidney disease with stage 1 through stage 4 chronic kidney disease, or unspecified chronic kidney disease: Secondary | ICD-10-CM | POA: Diagnosis not present

## 2021-05-02 DIAGNOSIS — Z0000 Encounter for general adult medical examination without abnormal findings: Secondary | ICD-10-CM | POA: Diagnosis not present

## 2021-05-02 DIAGNOSIS — K219 Gastro-esophageal reflux disease without esophagitis: Secondary | ICD-10-CM | POA: Diagnosis not present

## 2021-05-02 DIAGNOSIS — I671 Cerebral aneurysm, nonruptured: Secondary | ICD-10-CM | POA: Diagnosis not present

## 2021-05-02 DIAGNOSIS — N183 Chronic kidney disease, stage 3 unspecified: Secondary | ICD-10-CM | POA: Diagnosis not present

## 2021-05-02 DIAGNOSIS — M810 Age-related osteoporosis without current pathological fracture: Secondary | ICD-10-CM | POA: Diagnosis not present

## 2021-05-02 DIAGNOSIS — Z1159 Encounter for screening for other viral diseases: Secondary | ICD-10-CM | POA: Diagnosis not present

## 2021-05-02 DIAGNOSIS — L989 Disorder of the skin and subcutaneous tissue, unspecified: Secondary | ICD-10-CM | POA: Diagnosis not present

## 2021-05-02 DIAGNOSIS — E785 Hyperlipidemia, unspecified: Secondary | ICD-10-CM | POA: Diagnosis not present

## 2021-05-02 DIAGNOSIS — D6869 Other thrombophilia: Secondary | ICD-10-CM | POA: Diagnosis not present

## 2021-06-07 DIAGNOSIS — Z1231 Encounter for screening mammogram for malignant neoplasm of breast: Secondary | ICD-10-CM | POA: Diagnosis not present

## 2021-07-24 DIAGNOSIS — H9203 Otalgia, bilateral: Secondary | ICD-10-CM | POA: Diagnosis not present

## 2021-07-24 DIAGNOSIS — G8929 Other chronic pain: Secondary | ICD-10-CM | POA: Diagnosis not present

## 2021-07-24 DIAGNOSIS — R519 Headache, unspecified: Secondary | ICD-10-CM | POA: Diagnosis not present

## 2021-07-28 DIAGNOSIS — E785 Hyperlipidemia, unspecified: Secondary | ICD-10-CM | POA: Diagnosis not present

## 2021-07-28 DIAGNOSIS — Z1211 Encounter for screening for malignant neoplasm of colon: Secondary | ICD-10-CM | POA: Diagnosis not present

## 2021-07-28 DIAGNOSIS — N183 Chronic kidney disease, stage 3 unspecified: Secondary | ICD-10-CM | POA: Diagnosis not present

## 2021-07-28 DIAGNOSIS — Z1159 Encounter for screening for other viral diseases: Secondary | ICD-10-CM | POA: Diagnosis not present

## 2021-07-28 DIAGNOSIS — I129 Hypertensive chronic kidney disease with stage 1 through stage 4 chronic kidney disease, or unspecified chronic kidney disease: Secondary | ICD-10-CM | POA: Diagnosis not present

## 2021-07-28 DIAGNOSIS — H6690 Otitis media, unspecified, unspecified ear: Secondary | ICD-10-CM | POA: Diagnosis not present

## 2021-08-17 DIAGNOSIS — H6981 Other specified disorders of Eustachian tube, right ear: Secondary | ICD-10-CM | POA: Diagnosis not present

## 2021-08-17 DIAGNOSIS — H9041 Sensorineural hearing loss, unilateral, right ear, with unrestricted hearing on the contralateral side: Secondary | ICD-10-CM | POA: Diagnosis not present

## 2021-08-17 DIAGNOSIS — J310 Chronic rhinitis: Secondary | ICD-10-CM | POA: Diagnosis not present

## 2021-08-17 DIAGNOSIS — H6521 Chronic serous otitis media, right ear: Secondary | ICD-10-CM | POA: Diagnosis not present

## 2021-08-23 DIAGNOSIS — J343 Hypertrophy of nasal turbinates: Secondary | ICD-10-CM | POA: Diagnosis not present

## 2021-08-23 DIAGNOSIS — J310 Chronic rhinitis: Secondary | ICD-10-CM | POA: Diagnosis not present

## 2021-08-23 DIAGNOSIS — H6521 Chronic serous otitis media, right ear: Secondary | ICD-10-CM | POA: Diagnosis not present

## 2021-08-23 DIAGNOSIS — H9041 Sensorineural hearing loss, unilateral, right ear, with unrestricted hearing on the contralateral side: Secondary | ICD-10-CM | POA: Diagnosis not present

## 2021-08-23 DIAGNOSIS — J342 Deviated nasal septum: Secondary | ICD-10-CM | POA: Diagnosis not present

## 2021-08-23 DIAGNOSIS — H6981 Other specified disorders of Eustachian tube, right ear: Secondary | ICD-10-CM | POA: Diagnosis not present
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
## 2021-09-06 ENCOUNTER — Encounter: Payer: Self-pay | Admitting: Cardiovascular Disease

## 2021-09-06 ENCOUNTER — Ambulatory Visit: Payer: Medicare Other | Admitting: Cardiovascular Disease

## 2021-09-06 ENCOUNTER — Other Ambulatory Visit: Payer: Self-pay

## 2021-09-06 VITALS — BP 122/86 | HR 74 | Ht 64.0 in | Wt 149.0 lb

## 2021-09-06 DIAGNOSIS — Z7901 Long term (current) use of anticoagulants: Secondary | ICD-10-CM | POA: Diagnosis not present

## 2021-09-06 DIAGNOSIS — I10 Essential (primary) hypertension: Secondary | ICD-10-CM | POA: Diagnosis not present

## 2021-09-06 DIAGNOSIS — I480 Paroxysmal atrial fibrillation: Secondary | ICD-10-CM

## 2021-09-06 MED ORDER — RIVAROXABAN 20 MG PO TABS
ORAL_TABLET | ORAL | 3 refills | Status: DC
Start: 2021-09-06 — End: 2023-05-14

## 2021-09-06 NOTE — Patient Instructions (Signed)
Medication Instructions:  *If you need a refill on your cardiac medications before your next appointment, please call your pharmacy*  Lab Work: If you have labs (blood work) drawn today and your tests are completely normal, you will receive your results only by: [place] (if you have MyChart) OR A paper copy in the mail If you have any lab test that is abnormal or we need to change your treatment, we will call you to review the results.  Follow-Up: At [facility], you and your health needs are our priority.  As part of our continuing mission to provide you with exceptional heart care, we have created designated Provider Care Teams.  These Care Teams include your primary Cardiologist (physician) and Advanced Practice Providers (APPs -  Physician Assistants and Nurse Practitioners) who all work together to provide you with the care you need, when you need it.  We recommend signing up for the patient portal called "MyChart".  Sign up information is provided on this After Visit Summary.  MyChart is used to connect with patients for Virtual Visits (Telemedicine).  Patients are able to view lab/test results, encounter notes, upcoming appointments, etc.  Non-urgent messages can be sent to your provider as well.   To learn more about what you can do with MyChart, go to [email or web address].    Your next appointment:   12 year(s)  The format for your next appointment:   In Person  Provider:   You may see Jenkins Rouge, MD or one of the following Advanced Practice Providers on your designated Care Team:   Cecilie Kicks, NP

## 2021-09-12 DIAGNOSIS — H903 Sensorineural hearing loss, bilateral: Secondary | ICD-10-CM | POA: Diagnosis not present

## 2021-09-12 DIAGNOSIS — H7201 Central perforation of tympanic membrane, right ear: Secondary | ICD-10-CM | POA: Diagnosis not present

## 2021-09-12 DIAGNOSIS — H6983 Other specified disorders of Eustachian tube, bilateral: Secondary | ICD-10-CM | POA: Diagnosis not present

## 2021-09-27 DIAGNOSIS — H6981 Other specified disorders of Eustachian tube, right ear: Secondary | ICD-10-CM | POA: Diagnosis not present

## 2021-09-27 DIAGNOSIS — H7201 Central perforation of tympanic membrane, right ear: Secondary | ICD-10-CM | POA: Diagnosis not present

## 2021-09-27 DIAGNOSIS — H903 Sensorineural hearing loss, bilateral: Secondary | ICD-10-CM | POA: Diagnosis not present

## 2021-11-06 DIAGNOSIS — H35373 Puckering of macula, bilateral: Secondary | ICD-10-CM | POA: Diagnosis not present

## 2021-11-06 DIAGNOSIS — Z961 Presence of intraocular lens: Secondary | ICD-10-CM | POA: Diagnosis not present

## 2021-11-06 DIAGNOSIS — H04123 Dry eye syndrome of bilateral lacrimal glands: Secondary | ICD-10-CM | POA: Diagnosis not present

## 2022-01-15 DIAGNOSIS — L814 Other melanin hyperpigmentation: Secondary | ICD-10-CM | POA: Diagnosis not present

## 2022-01-15 DIAGNOSIS — D1801 Hemangioma of skin and subcutaneous tissue: Secondary | ICD-10-CM | POA: Diagnosis not present

## 2022-01-15 DIAGNOSIS — L820 Inflamed seborrheic keratosis: Secondary | ICD-10-CM | POA: Diagnosis not present

## 2022-01-15 DIAGNOSIS — L821 Other seborrheic keratosis: Secondary | ICD-10-CM | POA: Diagnosis not present

## 2022-01-15 DIAGNOSIS — L298 Other pruritus: Secondary | ICD-10-CM | POA: Diagnosis not present

## 2022-01-15 DIAGNOSIS — L538 Other specified erythematous conditions: Secondary | ICD-10-CM | POA: Diagnosis not present

## 2022-01-23 ENCOUNTER — Other Ambulatory Visit: Payer: Self-pay | Admitting: Family Medicine

## 2022-01-23 DIAGNOSIS — I671 Cerebral aneurysm, nonruptured: Secondary | ICD-10-CM

## 2022-03-15 DIAGNOSIS — L570 Actinic keratosis: Secondary | ICD-10-CM | POA: Diagnosis not present

## 2022-03-15 DIAGNOSIS — L298 Other pruritus: Secondary | ICD-10-CM | POA: Diagnosis not present

## 2022-03-15 DIAGNOSIS — L820 Inflamed seborrheic keratosis: Secondary | ICD-10-CM | POA: Diagnosis not present

## 2022-03-15 DIAGNOSIS — D225 Melanocytic nevi of trunk: Secondary | ICD-10-CM | POA: Diagnosis not present

## 2022-03-15 DIAGNOSIS — L590 Erythema ab igne [dermatitis ab igne]: Secondary | ICD-10-CM | POA: Diagnosis not present

## 2022-03-15 DIAGNOSIS — L814 Other melanin hyperpigmentation: Secondary | ICD-10-CM | POA: Diagnosis not present

## 2022-03-15 DIAGNOSIS — L821 Other seborrheic keratosis: Secondary | ICD-10-CM | POA: Diagnosis not present

## 2022-03-15 DIAGNOSIS — D485 Neoplasm of uncertain behavior of skin: Secondary | ICD-10-CM | POA: Diagnosis not present

## 2022-03-26 DIAGNOSIS — J329 Chronic sinusitis, unspecified: Secondary | ICD-10-CM | POA: Diagnosis not present

## 2022-05-10 ENCOUNTER — Other Ambulatory Visit: Payer: Self-pay | Admitting: Family Medicine

## 2022-05-10 DIAGNOSIS — I671 Cerebral aneurysm, nonruptured: Secondary | ICD-10-CM

## 2022-05-16 DIAGNOSIS — M25511 Pain in right shoulder: Secondary | ICD-10-CM | POA: Diagnosis not present

## 2022-05-16 DIAGNOSIS — M7061 Trochanteric bursitis, right hip: Secondary | ICD-10-CM | POA: Diagnosis not present

## 2022-05-16 DIAGNOSIS — M545 Low back pain, unspecified: Secondary | ICD-10-CM | POA: Diagnosis not present

## 2022-05-16 DIAGNOSIS — M7541 Impingement syndrome of right shoulder: Secondary | ICD-10-CM | POA: Diagnosis not present

## 2022-05-30 ENCOUNTER — Ambulatory Visit
Admission: RE | Admit: 2022-05-30 | Discharge: 2022-05-30 | Disposition: A | Discharge status: Home / Self Care | Payer: Medicare Other | Source: Ambulatory Visit | Attending: Family Medicine | Admitting: Family Medicine

## 2022-05-30 DIAGNOSIS — I671 Cerebral aneurysm, nonruptured: Secondary | ICD-10-CM

## 2022-06-13 DIAGNOSIS — Z1231 Encounter for screening mammogram for malignant neoplasm of breast: Secondary | ICD-10-CM | POA: Diagnosis not present

## 2022-06-28 DIAGNOSIS — M545 Low back pain, unspecified: Secondary | ICD-10-CM | POA: Diagnosis not present

## 2022-07-11 DIAGNOSIS — M5416 Radiculopathy, lumbar region: Secondary | ICD-10-CM | POA: Diagnosis not present

## 2022-07-31 DIAGNOSIS — M5136 Other intervertebral disc degeneration, lumbar region: Secondary | ICD-10-CM | POA: Diagnosis not present

## 2022-09-18 DIAGNOSIS — E785 Hyperlipidemia, unspecified: Secondary | ICD-10-CM | POA: Diagnosis not present

## 2022-09-18 DIAGNOSIS — M810 Age-related osteoporosis without current pathological fracture: Secondary | ICD-10-CM | POA: Diagnosis not present

## 2022-09-18 DIAGNOSIS — N183 Chronic kidney disease, stage 3 unspecified: Secondary | ICD-10-CM | POA: Diagnosis not present

## 2022-09-18 DIAGNOSIS — Z7901 Long term (current) use of anticoagulants: Secondary | ICD-10-CM | POA: Diagnosis not present

## 2022-09-18 DIAGNOSIS — K219 Gastro-esophageal reflux disease without esophagitis: Secondary | ICD-10-CM | POA: Diagnosis not present

## 2022-09-18 DIAGNOSIS — Z0000 Encounter for general adult medical examination without abnormal findings: Secondary | ICD-10-CM | POA: Diagnosis not present

## 2022-09-18 DIAGNOSIS — J3089 Other allergic rhinitis: Secondary | ICD-10-CM | POA: Diagnosis not present

## 2022-09-18 DIAGNOSIS — N281 Cyst of kidney, acquired: Secondary | ICD-10-CM | POA: Diagnosis not present

## 2022-09-18 DIAGNOSIS — I129 Hypertensive chronic kidney disease with stage 1 through stage 4 chronic kidney disease, or unspecified chronic kidney disease: Secondary | ICD-10-CM | POA: Diagnosis not present
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
## 2022-10-09 ENCOUNTER — Encounter: Payer: Self-pay | Admitting: Cardiovascular Disease

## 2022-10-09 ENCOUNTER — Ambulatory Visit: Payer: Medicare Other | Attending: Cardiovascular Disease | Admitting: Cardiovascular Disease

## 2022-10-09 VITALS — BP 124/68 | HR 70 | Ht 62.5 in | Wt 155.6 lb

## 2022-10-09 DIAGNOSIS — Z7901 Long term (current) use of anticoagulants: Secondary | ICD-10-CM | POA: Diagnosis not present

## 2022-10-09 DIAGNOSIS — I480 Paroxysmal atrial fibrillation: Secondary | ICD-10-CM

## 2022-10-09 DIAGNOSIS — I10 Essential (primary) hypertension: Secondary | ICD-10-CM | POA: Diagnosis not present

## 2022-10-09 DIAGNOSIS — E782 Mixed hyperlipidemia: Secondary | ICD-10-CM

## 2022-10-09 NOTE — Patient Instructions (Signed)
Medication Instructions:  Your physician recommends that you continue on your current medications as directed. Please refer to the Current Medication list given to you today.  *If you need a refill on your cardiac medications before your next appointment, please call your pharmacy*  Lab Work: Your physician recommends that you have lab work today- BMET  If you have labs (blood work) drawn today and your tests are completely normal, you will receive your results only by: MyChart Message (if you have MyChart) OR A paper copy in the mail If you have any lab test that is abnormal or we need to change your treatment, we will call you to review the results.  Testing/Procedures: Your physician has requested that you have cardiac CT for watchman. Cardiac computed tomography (CT) is a painless test that uses an x-ray machine to take clear, detailed pictures of your heart. For further information please visit [email or web address]. Please follow instruction sheet as given.  Follow-Up: At [facility], you and your health needs are our priority.  As part of our continuing mission to provide you with exceptional heart care, we have created designated Provider Care Teams.  These Care Teams include your primary Cardiologist (physician) and Advanced Practice Providers (APPs -  Physician Assistants and Nurse Practitioners) who all work together to provide you with the care you need, when you need it.  We recommend signing up for the patient portal called "MyChart".  Sign up information is provided on this After Visit Summary.  MyChart is used to connect with patients for Virtual Visits (Telemedicine).  Patients are able to view lab/test results, encounter notes, upcoming appointments, etc.  Non-urgent messages can be sent to your provider as well.   To learn more about what you can do with MyChart, go to [email or web address].    Your next appointment:   6 month(s)  The format for your next  appointment:   In Person  Provider:   Jenkins Rouge, MD     Other Instructions You have been referred to Dr. Quentin Ore for [facility].  You have been referred to lipid clinic.    Important Information About Sugar

## 2022-10-10 LAB — BASIC METABOLIC PANEL
BUN/Creatinine Ratio: 9 — ABNORMAL LOW (ref 12–28)
BUN: 10 mg/dL (ref 8–27)
CO2: 22 mmol/L (ref 20–29)
Calcium: 9.8 mg/dL (ref 8.7–10.3)
Chloride: 103 mmol/L (ref 96–106)
Creatinine, Ser: 1.15 mg/dL — ABNORMAL HIGH (ref 0.57–1.00)
Glucose: 88 mg/dL (ref 70–99)
Potassium: 4.6 mmol/L (ref 3.5–5.2)
Sodium: 140 mmol/L (ref 134–144)
eGFR: 49 mL/min/{1.73_m2} — ABNORMAL LOW (ref >59)

## 2022-10-15 DIAGNOSIS — R059 Cough, unspecified: Secondary | ICD-10-CM | POA: Diagnosis not present

## 2022-10-15 DIAGNOSIS — B349 Viral infection, unspecified: Secondary | ICD-10-CM | POA: Diagnosis not present

## 2022-10-19 ENCOUNTER — Telehealth (HOSPITAL_COMMUNITY): Payer: Self-pay | Admitting: Emergency Medicine

## 2022-10-19 ENCOUNTER — Encounter (HOSPITAL_COMMUNITY): Payer: Self-pay

## 2022-10-19 NOTE — Telephone Encounter (Signed)
Reaching out to patient to offer assistance regarding upcoming cardiac imaging study; pt verbalizes understanding of appt date/time, parking situation and where to check in, pre-test NPO status and medications ordered, and verified current allergies; name and call back number provided for further questions should they arise Marchia Bond RN Navigator Cardiac Imaging [facility] [phone] office [phone] cell  Arrival 1200, WC entrance '50mg'$  metoprolol  Denies iv issues

## 2022-10-22 ENCOUNTER — Encounter (HOSPITAL_COMMUNITY): Payer: Self-pay

## 2022-10-22 ENCOUNTER — Ambulatory Visit (HOSPITAL_COMMUNITY)
Admission: RE | Admit: 2022-10-22 | Discharge: 2022-10-22 | Disposition: A | Discharge status: Home / Self Care | Payer: Medicare Other | Source: Ambulatory Visit | Attending: Cardiovascular Disease | Admitting: Cardiovascular Disease

## 2022-10-22 DIAGNOSIS — Z7901 Long term (current) use of anticoagulants: Secondary | ICD-10-CM | POA: Diagnosis not present

## 2022-10-22 DIAGNOSIS — I10 Essential (primary) hypertension: Secondary | ICD-10-CM | POA: Insufficient documentation

## 2022-10-22 DIAGNOSIS — I480 Paroxysmal atrial fibrillation: Secondary | ICD-10-CM | POA: Insufficient documentation

## 2022-10-22 DIAGNOSIS — E782 Mixed hyperlipidemia: Secondary | ICD-10-CM | POA: Diagnosis not present

## 2022-10-22 MED ORDER — IOHEXOL 350 MG/ML SOLN
95.0000 mL | Freq: Once | INTRAVENOUS | Status: AC | PRN
  Administered 2022-10-22: 95 mL via INTRAVENOUS

## 2022-10-24 ENCOUNTER — Telehealth: Payer: Self-pay

## 2022-11-13 DIAGNOSIS — H524 Presbyopia: Secondary | ICD-10-CM | POA: Diagnosis not present

## 2022-11-13 DIAGNOSIS — H04123 Dry eye syndrome of bilateral lacrimal glands: Secondary | ICD-10-CM | POA: Diagnosis not present

## 2022-11-13 DIAGNOSIS — H43813 Vitreous degeneration, bilateral: Secondary | ICD-10-CM | POA: Diagnosis not present

## 2022-11-13 DIAGNOSIS — Z961 Presence of intraocular lens: Secondary | ICD-10-CM | POA: Diagnosis not present

## 2022-11-13 NOTE — Progress Notes (Unsigned)
Electrophysiology Office Note:    Date:  11/14/2022   ID:  Mary Riley, Mary Riley August 20, 1946, MRN [id number]  PCP:  Harlan Stains, MD  [facility] Cardiologist:  Jenkins Rouge, MD  [place] Electrophysiologist:  Vickie Epley, MD   Referring MD: Harlan Stains, MD   Chief Complaint: AF  History of Present Illness:    Mary Riley is a 76 y.o. female who presents for an evaluation of AF at the request of Dr Johnsie Cancel. Their medical history includes pAF diagnosis 2017, CKD3, GERD, HTN, hiatal hernia.  The patient last saw Dr Johnsie Cancel 10/09/2022 and expressed an interest in [place] as a mechanism to avoid long term exposure to [facility]. She has a history of aneurysmal disease (ophthalmic and splenic arteries).   Today she tells me that she has been feeling poorly for a couple weeks. Some shortness of breath and worsening fatigue. No signs of bleeding. No clear signs of HF.      Past Medical History:  Diagnosis Date   Abdominal pain Nov. 2014   Allergy    Aneurysm of splenic artery [facility]) Nov 2014   Ref by Dr. Arta Silence   Benign hematuria    Chronic kidney disease    stage 3   Dyslipidemia    GERD (gastroesophageal reflux disease)    History of Crohn's disease    Hypertension    Osteoporosis    Paroxysmal atrial fibrillation [facility])     Past Surgical History:  Procedure Laterality Date   ABDOMINAL HYSTERECTOMY     BILATERAL OOPHORECTOMY     d/t cyst   BREAST BIOPSY     benign   CATARACT EXTRACTION, BILATERAL     COLONOSCOPY  2006 and 2011   Dr. Sammuel Cooper   FOOT SURGERY Left 2012   [place] Right Aug. 2006   TUMOR EXCISION     from kidney    Current Medications: Current Meds  Medication Sig   albuterol (VENTOLIN HFA) 108 (90 Base) MCG/ACT inhaler Inhale 2 puffs into the lungs every 6 (six) hours as needed for wheezing or shortness of breath.   amLODipine (NORVASC) 5 MG tablet Take 1 tablet (5 mg total) by mouth daily.   ezetimibe (ZETIA) 10 MG tablet Take 10 mg  by mouth daily.   Lactobacillus Rhamnosus, GG, (CULTURELLE PO) Take 1 capsule by mouth every morning.   methocarbamol (ROBAXIN) 500 MG tablet Take 500 mg by mouth every 6 (six) hours. Back pain   metoprolol tartrate (LOPRESSOR) 50 MG tablet TAKE 1 TABLET(50 MG) BY MOUTH TWICE DAILY   montelukast (SINGULAIR) 10 MG tablet Take 10 mg by mouth daily.   pantoprazole (PROTONIX) 40 MG tablet Take 40 mg by mouth daily.   Potassium 99 MG TABS Take 1 tablet by mouth daily.   rivaroxaban (XARELTO) 20 MG TABS tablet TAKE 1 TABLET(20 MG) BY MOUTH DAILY WITH SUPPER     Allergies:   Dilaudid [hydromorphone hcl], Ace inhibitors, Codeine, Crestor [rosuvastatin], Demerol [meperidine], Lipitor [atorvastatin], Zithromax [azithromycin], Biaxin [clarithromycin], and Sulfa antibiotics   Social History   Socioeconomic History   Marital status: Married    Spouse name: Not on file   Number of children: Not on file   Years of education: Not on file   Highest education level: Not on file  Occupational History   Not on file  Tobacco Use   Smoking status: Never   Smokeless tobacco: Never  Substance and Sexual Activity   Alcohol use: Yes    Alcohol/week: 1.0  standard drink of alcohol    Types: 1 Glasses of wine per week   Drug use: No   Sexual activity: Not on file  Other Topics Concern   Not on file  Social History Narrative   Not on file   Social Determinants of Health   Financial Resource Strain: Not on file  Food Insecurity: Not on file  Transportation Needs: Not on file  Physical Activity: Not on file  Stress: Not on file  Social Connections: Not on file     Family History: The patient's family history includes Aneurysm in her mother and sister; Cancer in her brother and maternal grandmother; Heart disease in her sister; Heart disease (age of onset: 12) in her mother; Hypertension in her mother.  ROS:   Please see the history of present illness.    All other systems reviewed and are  negative.  EKGs/Labs/Other Studies Reviewed:    The following studies were reviewed today:  11/2019 echo EF 65 RV normal   10/22/2022 CT cardiac Morphology: [place] Morphology with two lobes (including one large posterior lobe) Ostial measurements: 24 mm X 19 mm Depth: 20 mm Notes on transseptal puncture: No evidence of PFO or ASD. Device Selection: An ostial deployment is needed to cover both lobes. 27 mm Device has sufficient depth and average ostial developments.  02/04/2016 EMS run sheet AF on ECG    ECG today shows sinus tachycardia with a rate of 125bpm.   Recent Labs: 11/14/2022: ALT WILL FOLLOW; BUN WILL FOLLOW; Creatinine, Ser WILL FOLLOW; Hemoglobin 13.2; Platelets 303; Potassium WILL FOLLOW; Sodium WILL FOLLOW; TSH WILL FOLLOW  Recent Lipid Panel    Component Value Date/Time   CHOL 259 (H) 02/03/2016 0421   TRIG 124 02/03/2016 0421   HDL 72 02/03/2016 0421   CHOLHDL 3.6 02/03/2016 0421   VLDL 25 02/03/2016 0421   LDLCALC 162 (H) 02/03/2016 0421    Physical Exam:    VS:  BP 128/62   Pulse (!) 124   Ht 5' 2.5" (1.588 m)   Wt 156 lb (70.8 kg)   SpO2 98%   BMI 28.08 kg/m     Wt Readings from Last 3 Encounters:  11/14/22 156 lb (70.8 kg)  10/09/22 155 lb 9.6 oz (70.6 kg)  09/06/21 149 lb (67.6 kg)     GEN:  Well nourished, well developed in no acute distress HEENT: Normal NECK: No JVD; No carotid bruits LYMPHATICS: No lymphadenopathy CARDIAC: Tachycardic. Regular rhythm, no murmurs, rubs, gallops RESPIRATORY:  Clear to auscultation without rales, wheezing or rhonchi  ABDOMEN: Soft, non-tender, non-distended MUSCULOSKELETAL:  No edema; No deformity  SKIN: Warm and dry NEUROLOGIC:  Alert and oriented x 3 PSYCHIATRIC:  Normal affect       ASSESSMENT:    1. PAF (paroxysmal atrial fibrillation) ([place])   2. HTN (hypertension), malignant    PLAN:    In order of problems listed above:  #Malaise, Sinus tachycardia Unclear cause. Check  CBC, CMP and TSH to make sure no readily identifiable cause. She thinks she may be a bit dehydrated, I have encouraged her to have good PO intake over the next few days I want her to come back for a check in the next 2-3 weeks to make sure she is feeling better. I gave clear ER precautions today (worsening fatigue, presyncope/syncope, chest pain, shortness of breath worsening)  #Paroxysmal AF Low burden of arrhythmia. Currently takes xarelto for stroke ppx. Interested in avoiding long term exposure to [facility].  --------------------  I have seen Tonique Hovland in the office today who is being considered for a Watchman left atrial appendage closure device. I believe they will benefit from this procedure given their history of atrial fibrillation, CHA2DS2-VASc score of 5 and unadjusted ischemic stroke rate of 7.2% per year. The patient's chart has been reviewed and I feel that they would be a candidate for short term oral anticoagulation after Watchman implant.   It is my belief that after undergoing a LAA closure procedure, Amea Kump will not need long term anticoagulation which eliminates anticoagulation side effects and major bleeding risk.   Procedural risks for the Watchman implant have been reviewed with the patient including a 0.5% risk of stroke, <1% risk of perforation and <1% risk of device embolization. Other risks include bleeding, vascular damage, tamponade, worsening renal function, and death. The patient understands these risk and wishes to proceed.     The published clinical data on the safety and effectiveness of WATCHMAN include but are not limited to the following: - Holmes DR, Mechele Claude, Sick P et al. for the PROTECT AF Investigators. Percutaneous closure of the left atrial appendage versus warfarin therapy for prevention of stroke in patients with atrial fibrillation: a randomised non-inferiority trial. Lancet 2009; 374: 534-42. Mechele Claude, Doshi SK, Abelardo Diesel D et al. on  behalf of the PROTECT AF Investigators. Percutaneous Left Atrial Appendage Closure for Stroke Prophylaxis in Patients With Atrial Fibrillation 2.3-Year Follow-up of the PROTECT AF (Watchman Left Atrial Appendage System for Embolic Protection in Patients With Atrial Fibrillation) Trial. Circulation 2013; 127:720-729. - Alli O, Doshi S,  Kar S, Reddy VY, Sievert H et al. Quality of Life Assessment in the Randomized PROTECT AF (Percutaneous Closure of the Left Atrial Appendage Versus Warfarin Therapy for Prevention of Stroke in Patients With Atrial Fibrillation) Trial of Patients at Risk for Stroke With Nonvalvular Atrial Fibrillation. J Am Coll Cardiol 2013; 41:9622-2. Vertell Limber DR, Tarri Abernethy, Price M, Barbour, Sievert H, Doshi S, Huber K, Reddy V. Prospective randomized evaluation of the Watchman left atrial appendage Device in patients with atrial fibrillation versus long-term warfarin therapy; the PREVAIL trial. Journal of the [employer] of Cardiology, Vol. 4, No. 1, 2014, 1-11. - Kar S, Doshi SK, Sadhu A, Horton R, Osorio J et al. Primary outcome evaluation of a next-generation left atrial appendage closure device: results from the PINNACLE FLX trial. Circulation 2021;143(18)1754-1762.    After today's visit with the patient which was dedicated solely for shared decision making visit regarding LAA closure device, the patient decided to proceed with the LAA appendage closure procedure scheduled to be done in the near future at [facility].   HAS-BLED score 2 Hypertension Yes  Abnormal renal and liver function (Dialysis, transplant, Cr >2.26 mg/dL /Cirrhosis or Bilirubin >2x Normal or AST/ALT/AP >3x Normal) No  Stroke No  Bleeding No  Labile INR (Unstable/high INR) No  Elderly (>65) Yes  Drugs or alcohol (? 8 drinks/week, anti-plt or NSAID) No   CHA2DS2-VASc Score = 5  The patient's score is based upon: CHF History: 0 HTN History: 1 Diabetes History: 0 Stroke History: 0 Vascular  Disease History: 1 Age Score: 2 Gender Score: 1      Medication Adjustments/Labs and Tests Ordered: Current medicines are reviewed at length with the patient today.  Concerns regarding medicines are outlined above.  Orders Placed This Encounter  Procedures   CBC   Comprehensive metabolic panel   TSH   EKG 12-Lead  ECHOCARDIOGRAM COMPLETE   No orders of the defined types were placed in this encounter.    Signed, Hilton Cork. Quentin Ore, MD, [facility], [facility] 11/14/2022 11:49 PM    Electrophysiology [facility]

## 2022-11-14 ENCOUNTER — Ambulatory Visit: Payer: Medicare Other | Attending: Cardiology | Admitting: Cardiology

## 2022-11-14 ENCOUNTER — Encounter: Payer: Self-pay | Admitting: Cardiology

## 2022-11-14 VITALS — BP 128/62 | HR 124 | Ht 62.5 in | Wt 156.0 lb

## 2022-11-14 DIAGNOSIS — I10 Essential (primary) hypertension: Secondary | ICD-10-CM | POA: Diagnosis not present

## 2022-11-14 DIAGNOSIS — I480 Paroxysmal atrial fibrillation: Secondary | ICD-10-CM

## 2022-11-14 NOTE — Patient Instructions (Addendum)
Medication Instructions:  Your physician recommends that you continue on your current medications as directed. Please refer to the Current Medication list given to you today.  *If you need a refill on your cardiac medications before your next appointment, please call your pharmacy*   Lab Work: TODAY: CBC, CMET, TSH If you have labs (blood work) drawn today and your tests are completely normal, you will receive your results only by: [place] (if you have MyChart) OR A paper copy in the mail If you have any lab test that is abnormal or we need to change your treatment, we will call you to review the results.  Testing/Procedures: Your physician has requested that you have an echocardiogram. Echocardiography is a painless test that uses sound waves to create images of your heart. It provides your doctor with information about the size and shape of your heart and how well your heart's chambers and valves are working. This procedure takes approximately one hour. There are no restrictions for this procedure. Please do NOT wear cologne, perfume, aftershave, or lotions (deodorant is allowed). Please arrive 15 minutes prior to your appointment time.  Follow-Up: At [facility], you and your health needs are our priority.  As part of our continuing mission to provide you with exceptional heart care, we have created designated Provider Care Teams.  These Care Teams include your primary Cardiologist (physician) and Advanced Practice Providers (APPs -  Physician Assistants and Nurse Practitioners) who all work together to provide you with the care you need, when you need it.  Your next appointment:   2-3 week(s)  The format for your next appointment:   In Person  Provider:   You will see one of the following Advanced Practice Providers on your designated Care Team:   Tommye Standard, [place] "Jonni Sanger" Chalmers Cater, [place] Or Dr. Quentin Ore   Important Information About Sugar

## 2022-11-15 ENCOUNTER — Ambulatory Visit: Payer: Medicare Other

## 2022-11-15 LAB — COMPREHENSIVE METABOLIC PANEL
ALT: 23 IU/L (ref 0–32)
AST: 25 IU/L (ref 0–40)
Albumin/Globulin Ratio: 2.1 (ref 1.2–2.2)
Albumin: 4.8 g/dL (ref 3.8–4.8)
Alkaline Phosphatase: 77 IU/L (ref 44–121)
BUN/Creatinine Ratio: 14 (ref 12–28)
BUN: 22 mg/dL (ref 8–27)
Bilirubin Total: 0.2 mg/dL (ref 0.0–1.2)
CO2: 25 mmol/L (ref 20–29)
Calcium: 9.5 mg/dL (ref 8.7–10.3)
Chloride: 96 mmol/L (ref 96–106)
Creatinine, Ser: 1.52 mg/dL — ABNORMAL HIGH (ref 0.57–1.00)
Globulin, Total: 2.3 g/dL (ref 1.5–4.5)
Glucose: 93 mg/dL (ref 70–99)
Potassium: 3.5 mmol/L (ref 3.5–5.2)
Sodium: 137 mmol/L (ref 134–144)
Total Protein: 7.1 g/dL (ref 6.0–8.5)
eGFR: 35 mL/min/{1.73_m2} — ABNORMAL LOW (ref >59)

## 2022-11-15 LAB — CBC
Hematocrit: 38.7 % (ref 34.0–46.6)
Hemoglobin: 13.2 g/dL (ref 11.1–15.9)
MCH: 31 pg (ref 26.6–33.0)
MCHC: 34.1 g/dL (ref 31.5–35.7)
MCV: 91 fL (ref 79–97)
Platelets: 303 10*3/uL (ref 150–450)
RBC: 4.26 x10E6/uL (ref 3.77–5.28)
RDW: 11.9 % (ref 11.7–15.4)
WBC: 9 10*3/uL (ref 3.4–10.8)

## 2022-11-15 LAB — TSH: TSH: 2.87 u[IU]/mL (ref 0.450–4.500)

## 2022-12-06 ENCOUNTER — Ambulatory Visit (HOSPITAL_COMMUNITY): Payer: Medicare Other | Attending: Cardiology

## 2022-12-06 DIAGNOSIS — I480 Paroxysmal atrial fibrillation: Secondary | ICD-10-CM | POA: Diagnosis not present

## 2022-12-06 DIAGNOSIS — I10 Essential (primary) hypertension: Secondary | ICD-10-CM | POA: Diagnosis not present

## 2022-12-06 LAB — ECHOCARDIOGRAM COMPLETE
Area-P 1/2: 4.23 cm2
S' Lateral: 2.3 cm

## 2022-12-07 ENCOUNTER — Ambulatory Visit: Payer: Medicare Other | Admitting: Cardiovascular Disease

## 2022-12-24 ENCOUNTER — Ambulatory Visit: Payer: Medicare Other | Attending: Cardiology | Admitting: Pharmacist

## 2022-12-24 DIAGNOSIS — E782 Mixed hyperlipidemia: Secondary | ICD-10-CM

## 2022-12-24 DIAGNOSIS — E7801 Familial hypercholesterolemia: Secondary | ICD-10-CM | POA: Diagnosis not present

## 2022-12-24 MED ORDER — REPATHA SURECLICK 140 MG/ML ~~LOC~~ SOAJ: 1.0000 | SUBCUTANEOUS | 3 refills | Status: DC

## 2022-12-24 NOTE — Progress Notes (Addendum)
Patient ID: [name]                 DOB: 1946/02/26                    MRN: [id number]     HPI: Mary Riley is a 77 y.o. female patient referred to lipid clinic by Dr Johnsie Cancel. PMH is significant for HTN, splenic artery aneurysm, PAF, CKD, Crohn's, osteoporosis, and GERD. Cardiac CT 10/2022 showed aortic atherosclerosis but calcium score of 0. Baseline LDL > 200, currently in the 170s on ezetimibe and hx of statin intolerance, referred to lipid clinic for further management.  Pt presents today for follow up. Has been taking ezetimibe for about 2 years and tolerating well. Had trouble walking from joint pain on statins. Tolerated atorvastatin for a few years before developing joint pain. Experienced quicker onset with subsequent statins within 30-45 days. Joint pain resolved with statin discontinuation in each case.   + for familial hypercholesterolemia with Mary Riley lipid score of (1 pt - FHx premature CAD, 4 pts - corneal arcus, 3 pts - baseline LDL 229)  Current Medications: ezetimibe '10mg'$  daily Intolerances: atorvastatin, rosuvastatin, Livalo '1mg'$  3x/week - joint pain Risk Factors: FH, FHx CAD, aortic atherosclerosis on cardiac CT LDL goal: '100mg'$ /dL; aggressive goal < '70mg'$ /dL  Diet: spaghetti, tuna sandwich, roast, green beans, potatoes, not much bread, likes chocolate  Exercise: had been walking 1 mile a day, not exercising much now, plans to start going to the gym  Family History: Aneurysm in her mother and sister; Cancer in her brother and maternal grandmother; Heart disease in her sister; Heart disease (age of onset: 76) in her mother; Hypertension in her mother.   Social History: occasional alcohol, no tobacco or drug use  Labs: 09/18/22: TC 288, TG 215, HDL 72, LDL 176, nonHDL 216 (ezetimibe '10mg'$  daily) 12/06/20: TC 272, TG 119, HDL 76, LDL 175, nonHDL 196 (ezetimibe '10mg'$  daily) 10/18/20: TC 334, TG 137, LDL 229, nonHDL 253  Past Medical History:  Diagnosis Date   Abdominal pain  Nov. 2014   Allergy    Aneurysm of splenic artery [facility]) Nov 2014   Ref by Dr. Arta Silence   Benign hematuria    Chronic kidney disease    stage 3   Dyslipidemia    GERD (gastroesophageal reflux disease)    History of Crohn's disease    Hypertension    Osteoporosis    Paroxysmal atrial fibrillation ([place])     Current Outpatient Medications on File Prior to Visit  Medication Sig Dispense Refill   albuterol (VENTOLIN HFA) 108 (90 Base) MCG/ACT inhaler Inhale 2 puffs into the lungs every 6 (six) hours as needed for wheezing or shortness of breath.     amLODipine (NORVASC) 5 MG tablet Take 1 tablet (5 mg total) by mouth daily. 90 tablet 3   ezetimibe (ZETIA) 10 MG tablet Take 10 mg by mouth daily.     Lactobacillus Rhamnosus, GG, (CULTURELLE PO) Take 1 capsule by mouth every morning.     methocarbamol (ROBAXIN) 500 MG tablet Take 500 mg by mouth every 6 (six) hours. Back pain     metoprolol tartrate (LOPRESSOR) 50 MG tablet TAKE 1 TABLET(50 MG) BY MOUTH TWICE DAILY 180 tablet 3   montelukast (SINGULAIR) 10 MG tablet Take 10 mg by mouth daily.     pantoprazole (PROTONIX) 40 MG tablet Take 40 mg by mouth daily.     Potassium 99 MG TABS Take 1 tablet by mouth  daily.     rivaroxaban (XARELTO) 20 MG TABS tablet TAKE 1 TABLET(20 MG) BY MOUTH DAILY WITH SUPPER 90 tablet 3   No current facility-administered medications on file prior to visit.    Allergies  Allergen Reactions   Dilaudid [Hydromorphone Hcl] Nausea And Vomiting   Ace Inhibitors Cough   Codeine Nausea Only   Crestor [Rosuvastatin] Other (See Comments)    Joints hurt   Demerol [Meperidine] Nausea Only   Lipitor [Atorvastatin] Other (See Comments)    Joints hurt   Zithromax [Azithromycin]     unknown   Biaxin [Clarithromycin] Rash   Sulfa Antibiotics Rash    Assessment/Plan:  1. Familial hypercholesterolemia - Mary Riley lipid score of 8. Also with aortic atherosclerosis noted on coronary CTA however calcium score was 0.  Discussed LDL goal of at least < 100, could be more aggressive with < 70 goal. Her LDL is currently 176 on ezetimibe '10mg'$  daily. She is intolerant to 3 statins. Discussed addition of PCSK9i including expected benefits and injection technique. Prior authorization for Repatha has been submitted and approved through 06/23/22. F/u labs scheduled in 2-3 months to assess efficacy.  Mary Riley, PharmD, BCACP, [facility]. [address] [place], [place] [address] Phone: [phone]; Fax: [phone] 12/24/2022 2:57 PM

## 2022-12-24 NOTE — Addendum Note (Signed)
Addended by: Shavana Calder E on: 12/24/2022 04:42 PM   Modules accepted: Orders

## 2022-12-24 NOTE — Patient Instructions (Addendum)
Your LDL cholesterol is 176 and your goal is at least < 100 (<70 would be a more aggressive goal)  Continue ezetimibe  Start Repatha injections once every 2 weeks in the fatty tissue of your stomach or upper outer thigh. Store the medication in the fridge. You can let your dose warm up to room temperature for 30 minutes before injecting if you prefer. Repatha will lower your LDL cholesterol by 60% and helps to lower your chance of having a heart attack or stroke.  Recheck fasting cholesterol on Monday, March 4th any time after 7:30am

## 2022-12-25 NOTE — Progress Notes (Unsigned)
   PCP:  Harlan Stains, MD Primary Cardiologist: Jenkins Rouge, MD Electrophysiologist: Vickie Epley, MD   Mary Riley is a 77 y.o. female seen today for Vickie Epley, MD for routine electrophysiology followup. Since last being seen in our clinic the patient reports doing very well. Her HRs returned to normal within 1-2 days after last visit with rest and hydration. She has not felt any breakthrough AF.   she denies chest pain, palpitations, dyspnea, PND, orthopnea, nausea, vomiting, dizziness, syncope, edema, weight gain, or early satiety.   Pt  has a past medical history of Abdominal pain (Nov. 2014), Allergy, Aneurysm of splenic artery [facility]) (Nov 2014), Benign hematuria, Chronic kidney disease, Dyslipidemia, GERD (gastroesophageal reflux disease), History of Crohn's disease, Hypertension, Osteoporosis, and Paroxysmal atrial fibrillation ([place]).  Current Outpatient Medications  Medication Instructions   albuterol (VENTOLIN HFA) 108 (90 Base) MCG/ACT inhaler 2 puffs, Inhalation, Every 6 hours PRN   amLODipine (NORVASC) 5 mg, Oral, Daily   ezetimibe (ZETIA) 10 mg, Oral, Daily   Lactobacillus Rhamnosus, GG, (CULTURELLE PO) 1 capsule, Oral, BH-each morning   methocarbamol (ROBAXIN) 500 mg, Oral, Every 6 hours, Back pain    metoprolol tartrate (LOPRESSOR) 50 MG tablet TAKE 1 TABLET(50 MG) BY MOUTH TWICE DAILY   montelukast (SINGULAIR) 10 mg, Oral, Daily   pantoprazole (PROTONIX) 40 mg, Oral, Daily   Potassium 99 MG TABS 1 tablet, Oral, Daily   Repatha SureClick 973 mg, Subcutaneous, Every 14 days   rivaroxaban (XARELTO) 20 MG TABS tablet TAKE 1 TABLET(20 MG) BY MOUTH DAILY WITH SUPPER    Physical Exam: Vitals:   12/27/22 1003  BP: 122/64  Pulse: 77  SpO2: 100%  Weight: 157 lb (71.2 kg)  Height: '5\' 3"'$  (1.6 m)    GEN- The patient is well appearing, alert and oriented x 3 today.   HEENT: normocephalic, atraumatic Lungs- Clear to ausculation bilaterally, normal work of  breathing.   Heart- Regular rate and rhythm, no murmurs, rubs or gallops,  Extremities- No peripheral edema. no clubbing or cyanosis; Skin- warm and dry, no rash or lesion Psych- euthymic mood, full affect  EKG is not ordered today. She has a regular rate and rhythm on exam.   Additional studies reviewed include: Previous EP notes.   ECHO 12/06/2022 LVEF 60-65%, Grade 2 DD, normal RV, Mod LAE, Mild MR   Assessment and Plan:  Malaise 2.  Sinus tachycardia Resolved. Possible viral vs dehydration.  Echo 12/06/2022 60-65%, Grade 2 Repeat labwork.    3. Paroxysmal AF Low burden overall.  Pt would prefer to avoid long term exposure to [facility].  She has discussed Watchman with Dr. Quentin Ore. She has had Echo and Cardiac CT. She is agreeable to proceeding as is Dr. Quentin Ore. Will reach out to Structural team  Follow up with Dr. Quentin Ore as usual post procedure  Shirley Friar, PA-C  12/27/22 10:20 AM

## 2022-12-27 ENCOUNTER — Encounter: Payer: Self-pay | Admitting: Student

## 2022-12-27 ENCOUNTER — Telehealth: Payer: Self-pay

## 2022-12-27 ENCOUNTER — Ambulatory Visit: Payer: Medicare Other | Attending: Student | Admitting: Student

## 2022-12-27 VITALS — BP 122/64 | HR 77 | Ht 63.0 in | Wt 157.0 lb

## 2022-12-27 DIAGNOSIS — I480 Paroxysmal atrial fibrillation: Secondary | ICD-10-CM | POA: Diagnosis not present

## 2022-12-27 DIAGNOSIS — I10 Essential (primary) hypertension: Secondary | ICD-10-CM

## 2022-12-27 DIAGNOSIS — R5381 Other malaise: Secondary | ICD-10-CM | POA: Diagnosis not present

## 2022-12-27 LAB — CBC

## 2022-12-27 NOTE — Telephone Encounter (Signed)
The patient now feels much better (see Jonni Sanger Tillery's note from today, 12/27/22). She wishes to proceed with LAAO on 03/07/2023. Scheduled her for pre-procedure visit on 02/18/2023 (she was coming to the office for blood work anyway). She was grateful for call and agreed with plan.

## 2022-12-27 NOTE — Patient Instructions (Signed)
Medication Instructions:  Your physician recommends that you continue on your current medications as directed. Please refer to the Current Medication list given to you today.  *If you need a refill on your cardiac medications before your next appointment, please call your pharmacy*   Lab Work: TODAY: BMET, CBC  If you have labs (blood work) drawn today and your tests are completely normal, you will receive your results only by: [place] (if you have MyChart) OR A paper copy in the mail If you have any lab test that is abnormal or we need to change your treatment, we will call you to review the results.   Follow-Up: At [facility], you and your health needs are our priority.  As part of our continuing mission to provide you with exceptional heart care, we have created designated Provider Care Teams.  These Care Teams include your primary Cardiologist (physician) and Advanced Practice Providers (APPs -  Physician Assistants and Nurse Practitioners) who all work together to provide you with the care you need, when you need it.   Your next appointment:   Pending Watchman procedure

## 2022-12-28 LAB — CBC
Hematocrit: 40.9 % (ref 34.0–46.6)
Hemoglobin: 13.4 g/dL (ref 11.1–15.9)
MCH: 29.8 pg (ref 26.6–33.0)
MCHC: 32.8 g/dL (ref 31.5–35.7)
MCV: 91 fL (ref 79–97)
Platelets: 308 10*3/uL (ref 150–450)
RBC: 4.49 x10E6/uL (ref 3.77–5.28)
RDW: 12 % (ref 11.7–15.4)
WBC: 7 10*3/uL (ref 3.4–10.8)

## 2022-12-28 LAB — COMPREHENSIVE METABOLIC PANEL
ALT: 18 IU/L (ref 0–32)
AST: 18 IU/L (ref 0–40)
Albumin/Globulin Ratio: 2.2 (ref 1.2–2.2)
Albumin: 4.7 g/dL (ref 3.8–4.8)
Alkaline Phosphatase: 67 IU/L (ref 44–121)
BUN/Creatinine Ratio: 12 (ref 12–28)
BUN: 15 mg/dL (ref 8–27)
Bilirubin Total: 0.5 mg/dL (ref 0.0–1.2)
CO2: 22 mmol/L (ref 20–29)
Calcium: 9.6 mg/dL (ref 8.7–10.3)
Chloride: 102 mmol/L (ref 96–106)
Creatinine, Ser: 1.23 mg/dL — ABNORMAL HIGH (ref 0.57–1.00)
Globulin, Total: 2.1 g/dL (ref 1.5–4.5)
Glucose: 88 mg/dL (ref 70–99)
Potassium: 4.4 mmol/L (ref 3.5–5.2)
Sodium: 138 mmol/L (ref 134–144)
Total Protein: 6.8 g/dL (ref 6.0–8.5)
eGFR: 45 mL/min/{1.73_m2} — ABNORMAL LOW (ref >59)

## 2022-12-31 ENCOUNTER — Other Ambulatory Visit: Payer: Self-pay

## 2022-12-31 DIAGNOSIS — I480 Paroxysmal atrial fibrillation: Secondary | ICD-10-CM

## 2023-01-02 ENCOUNTER — Telehealth: Payer: Self-pay

## 2023-01-18 DIAGNOSIS — J069 Acute upper respiratory infection, unspecified: Secondary | ICD-10-CM | POA: Diagnosis not present

## 2023-02-15 NOTE — Progress Notes (Unsigned)
[facility]                                     Cardiology Office Note:    Date:  02/18/2023   ID:  Mary Riley, DOB Sep 02, 1946, MRN [id number]  PCP:  Harlan Stains, MD  [facility] Cardiologist:  Jenkins Rouge, MD  [facility] Electrophysiologist:  Vickie Epley, MD   Referring MD: Harlan Stains, MD   Chief Complaint  Patient presents with   Follow-up    Pre LAAO    History of Present Illness:    Mary Riley is a 77 y.o. female with a hx of paroxysmal atrial fibrillation, CKD stage III, GERD, HTN, and hiatal hernia.   She was referred to Dr. Quentin Ore for the evaluation of Saline closure with Watchman. In consultation she was noted to be quite tachycardic therefore labs were performed that were reassuring. She was seen back several weeks later with HR returning to normal range. Given this, plan was to proceed with Watchman workup. CT imaging showed anatomy suitable for implant and was scheduled for 03/07/23.   Today she presents for pre LAAO evaluation and states that she has been well. She has no chest pain, palpitations, LE edema, orthopnea, PND, dizziness, or syncope.   Past Medical History:  Diagnosis Date   Abdominal pain Nov. 2014   Allergy    Aneurysm of splenic artery [facility]) Nov 2014   Ref by Dr. Arta Silence   Benign hematuria    Chronic kidney disease    stage 3   Dyslipidemia    GERD (gastroesophageal reflux disease)    History of Crohn's disease    Hypertension    Osteoporosis    Paroxysmal atrial fibrillation [facility])     Past Surgical History:  Procedure Laterality Date   ABDOMINAL HYSTERECTOMY     BILATERAL OOPHORECTOMY     d/t cyst   BREAST BIOPSY     benign   CATARACT EXTRACTION, BILATERAL     COLONOSCOPY  2006 and 2011   Dr. Sammuel Cooper   FOOT SURGERY Left 2012   [place] Right Aug. 2006   TUMOR EXCISION     from kidney    Current Medications: Current Meds  Medication Sig   albuterol (VENTOLIN HFA) 108 (90 Base)  MCG/ACT inhaler Inhale 2 puffs into the lungs every 6 (six) hours as needed for wheezing or shortness of breath.   amLODipine (NORVASC) 5 MG tablet Take 1 tablet (5 mg total) by mouth daily.   Evolocumab (REPATHA SURECLICK) XX123456 MG/ML SOAJ Inject 140 mg into the skin every 14 (fourteen) days.   ezetimibe (ZETIA) 10 MG tablet Take 10 mg by mouth daily.   Lactobacillus Rhamnosus, GG, (CULTURELLE PO) Take 1 capsule by mouth every morning.   methocarbamol (ROBAXIN) 500 MG tablet Take 500 mg by mouth every 6 (six) hours. Back pain   metoprolol tartrate (LOPRESSOR) 50 MG tablet TAKE 1 TABLET(50 MG) BY MOUTH TWICE DAILY   montelukast (SINGULAIR) 10 MG tablet Take 10 mg by mouth daily.   pantoprazole (PROTONIX) 40 MG tablet Take 40 mg by mouth daily.   Potassium 99 MG TABS Take 1 tablet by mouth daily.   rivaroxaban (XARELTO) 20 MG TABS tablet TAKE 1 TABLET(20 MG) BY MOUTH DAILY WITH SUPPER     Allergies:   Dilaudid [hydromorphone hcl], Ace inhibitors, Codeine, Crestor [rosuvastatin], Demerol [meperidine], Lipitor [atorvastatin], Zithromax [azithromycin],  Biaxin [clarithromycin], and Sulfa antibiotics   Social History   Socioeconomic History   Marital status: Married    Spouse name: Not on file   Number of children: Not on file   Years of education: Not on file   Highest education level: Not on file  Occupational History   Not on file  Tobacco Use   Smoking status: Never   Smokeless tobacco: Never  Substance and Sexual Activity   Alcohol use: Yes    Alcohol/week: 1.0 standard drink of alcohol    Types: 1 Glasses of wine per week   Drug use: No   Sexual activity: Not on file  Other Topics Concern   Not on file  Social History Narrative   Not on file   Social Determinants of Health   Financial Resource Strain: Not on file  Food Insecurity: Not on file  Transportation Needs: Not on file  Physical Activity: Not on file  Stress: Not on file  Social Connections: Not on file    Family  History: The patient's family history includes Aneurysm in her mother and sister; Cancer in her brother and maternal grandmother; Heart disease in her sister; Heart disease (age of onset: 69) in her mother; Hypertension in her mother.  ROS:   Please see the history of present illness.    All other systems reviewed and are negative.  EKGs/Labs/Other Studies Reviewed:    The following studies were reviewed today:  CT 14-Nov-2022:   IMPRESSION: 1. There is normal variant pulmonary vein drainage into the left atrium.   2. The left atrial appendage is patent.   3. The esophagus runs in the left atrial midline but is closest to the left lower pulmonary vein.  EKG:  EKG is ordered today.  The ekg ordered today demonstrates NSR with HR 63bpm  Recent Labs: 11/14/2022: TSH 2.870 12/27/2022: ALT 18; BUN 15; Creatinine, Ser 1.23; Hemoglobin 13.4; Platelets 308; Potassium 4.4; Sodium 138   Recent Lipid Panel    Component Value Date/Time   CHOL 259 (H) 02/03/2016 0421   TRIG 124 02/03/2016 0421   HDL 72 02/03/2016 0421   CHOLHDL 3.6 02/03/2016 0421   VLDL 25 02/03/2016 0421   LDLCALC 162 (H) 02/03/2016 0421   Risk Assessment/Calculations:    HAS-BLED score 2 Hypertension Yes  Abnormal renal and liver function (Dialysis, transplant, Cr >2.26 mg/dL /Cirrhosis or Bilirubin >2x Normal or AST/ALT/AP >3x Normal) No  Stroke No  Bleeding No  Labile INR (Unstable/high INR) No  Elderly (>65) Yes  Drugs or alcohol (? 8 drinks/week, anti-plt or NSAID) No    CHA2DS2-VASc Score = 5  The patient's score is based upon: CHF History: 0 HTN History: 1 Diabetes History: 0 Stroke History: 0 Vascular Disease History: 1 Age Score: 2 Gender Score: 1  Physical Exam:    VS:  BP 130/72   Pulse 63   Ht '5\' 3"'$  (1.6 m)   Wt 160 lb 3.2 oz (72.7 kg)   SpO2 99%   BMI 28.38 kg/m     Wt Readings from Last 3 Encounters:  02/18/23 160 lb 3.2 oz (72.7 kg)  12/27/22 157 lb (71.2 kg)  11/14/22 156 lb  (70.8 kg)    General: Well developed, well nourished, NAD Lungs:Clear to ausculation bilaterally. No wheezes, rales, or rhonchi. Breathing is unlabored. Cardiovascular: RRR with S1 S2. No murmurs Extremities: No edema.  Neuro: Alert and oriented. No focal deficits. No facial asymmetry. MAE spontaneously. Psych: Responds to questions appropriately with  normal affect.    ASSESSMENT/PLAN:    PAF: Wishes to pursue Watchman implant to avoid long term anticoagulation due to side effects and risk of major bleeding. Pre procedure instructions reviewed with understanding. CHG soap given. Obtain CBC, BMET today.   CKD stage II: Obtain labs today. Baseline Cr appears to be in the 1.1 range.   HTN: Stable with no changes needed today.   Medication Adjustments/Labs and Tests Ordered: Current medicines are reviewed at length with the patient today.  Concerns regarding medicines are outlined above.  Orders Placed This Encounter  Procedures   Basic metabolic panel   CBC   EKG 12-Lead   No orders of the defined types were placed in this encounter.   Patient Instructions  Medication Instructions:  Your physician recommends that you continue on your current medications as directed. Please refer to the Current Medication list given to you today.  *If you need a refill on your cardiac medications before your next appointment, please call your pharmacy*   Lab Work: TODAY: BMET, CBC If you have labs (blood work) drawn today and your tests are completely normal, you will receive your results only by: [place] (if you have MyChart) OR A paper copy in the mail If you have any lab test that is abnormal or we need to change your treatment, we will call you to review the results.   Testing/Procedures: SEE INSTRUCTION LETTER   Follow-Up: At [facility], you and your health needs are our priority.  As part of our continuing mission to provide you with exceptional heart care, we have  created designated Provider Care Teams.  These Care Teams include your primary Cardiologist (physician) and Advanced Practice Providers (APPs -  Physician Assistants and Nurse Practitioners) who all work together to provide you with the care you need, when you need it.  We recommend signing up for the patient portal called "MyChart".  Sign up information is provided on this After Visit Summary.  MyChart is used to connect with patients for Virtual Visits (Telemedicine).  Patients are able to view lab/test results, encounter notes, upcoming appointments, etc.  Non-urgent messages can be sent to your provider as well.   To learn more about what you can do with MyChart, go to [email or web address].    Your next appointment:   POST PROCEDURE   Signed, Kathyrn Drown, NP  02/18/2023 11:19 AM    [place]

## 2023-02-18 ENCOUNTER — Ambulatory Visit: Payer: Medicare Other | Admitting: Cardiology

## 2023-02-18 ENCOUNTER — Ambulatory Visit: Payer: Medicare Other | Attending: Cardiology

## 2023-02-18 VITALS — BP 130/72 | HR 63 | Ht 63.0 in | Wt 160.2 lb

## 2023-02-18 DIAGNOSIS — Z7901 Long term (current) use of anticoagulants: Secondary | ICD-10-CM

## 2023-02-18 DIAGNOSIS — I10 Essential (primary) hypertension: Secondary | ICD-10-CM

## 2023-02-18 DIAGNOSIS — Z01818 Encounter for other preprocedural examination: Secondary | ICD-10-CM

## 2023-02-18 DIAGNOSIS — I480 Paroxysmal atrial fibrillation: Secondary | ICD-10-CM | POA: Diagnosis not present

## 2023-02-18 DIAGNOSIS — E782 Mixed hyperlipidemia: Secondary | ICD-10-CM

## 2023-02-18 LAB — HEPATIC FUNCTION PANEL
ALT: 13 IU/L (ref 0–32)
AST: 17 IU/L (ref 0–40)
Albumin: 4.4 g/dL (ref 3.8–4.8)
Alkaline Phosphatase: 80 IU/L (ref 44–121)
Bilirubin Total: 0.2 mg/dL (ref 0.0–1.2)
Bilirubin, Direct: <0.1 mg/dL (ref 0.00–0.40)
Total Protein: 6.6 g/dL (ref 6.0–8.5)

## 2023-02-18 LAB — LIPID PANEL
Chol/HDL Ratio: 2.3 ratio (ref 0.0–4.4)
Cholesterol, Total: 200 mg/dL — ABNORMAL HIGH (ref 100–199)
HDL: 88 mg/dL (ref >39)
LDL Chol Calc (NIH): 90 mg/dL (ref 0–99)
Triglycerides: 128 mg/dL (ref 0–149)
VLDL Cholesterol Cal: 22 mg/dL (ref 5–40)

## 2023-02-18 LAB — CBC

## 2023-02-18 NOTE — Patient Instructions (Signed)
Medication Instructions:  Your physician recommends that you continue on your current medications as directed. Please refer to the Current Medication list given to you today.  *If you need a refill on your cardiac medications before your next appointment, please call your pharmacy*   Lab Work: TODAY: BMET, CBC If you have labs (blood work) drawn today and your tests are completely normal, you will receive your results only by: [place] (if you have MyChart) OR A paper copy in the mail If you have any lab test that is abnormal or we need to change your treatment, we will call you to review the results.   Testing/Procedures: SEE INSTRUCTION LETTER   Follow-Up: At [facility], you and your health needs are our priority.  As part of our continuing mission to provide you with exceptional heart care, we have created designated Provider Care Teams.  These Care Teams include your primary Cardiologist (physician) and Advanced Practice Providers (APPs -  Physician Assistants and Nurse Practitioners) who all work together to provide you with the care you need, when you need it.  We recommend signing up for the patient portal called "MyChart".  Sign up information is provided on this After Visit Summary.  MyChart is used to connect with patients for Virtual Visits (Telemedicine).  Patients are able to view lab/test results, encounter notes, upcoming appointments, etc.  Non-urgent messages can be sent to your provider as well.   To learn more about what you can do with MyChart, go to [email or web address].    Your next appointment:   POST PROCEDURE

## 2023-02-19 LAB — CBC
Hematocrit: 40.1 % (ref 34.0–46.6)
Hemoglobin: 13.1 g/dL (ref 11.1–15.9)
MCH: 30.5 pg (ref 26.6–33.0)
MCHC: 32.7 g/dL (ref 31.5–35.7)
MCV: 94 fL (ref 79–97)
Platelets: 327 10*3/uL (ref 150–450)
RBC: 4.29 x10E6/uL (ref 3.77–5.28)
RDW: 12.9 % (ref 11.7–15.4)
WBC: 8.1 10*3/uL (ref 3.4–10.8)

## 2023-02-19 LAB — BASIC METABOLIC PANEL
BUN/Creatinine Ratio: 15 (ref 12–28)
BUN: 19 mg/dL (ref 8–27)
CO2: 23 mmol/L (ref 20–29)
Calcium: 9.7 mg/dL (ref 8.7–10.3)
Chloride: 102 mmol/L (ref 96–106)
Creatinine, Ser: 1.23 mg/dL — ABNORMAL HIGH (ref 0.57–1.00)
Glucose: 94 mg/dL (ref 70–99)
Potassium: 4.4 mmol/L (ref 3.5–5.2)
Sodium: 139 mmol/L (ref 134–144)
eGFR: 45 mL/min/{1.73_m2} — ABNORMAL LOW (ref >59)

## 2023-03-04 ENCOUNTER — Telehealth: Payer: Self-pay

## 2023-03-04 NOTE — Telephone Encounter (Signed)
Confirmed procedure date of 03/07/2023. Confirmed arrival time of 0700 for procedure time at 0930. Reviewed pre-procedure instructions with patient. She understands to call if questions/concerns arise prior to procedure.

## 2023-03-06 ENCOUNTER — Telehealth: Payer: Self-pay

## 2023-03-06 ENCOUNTER — Other Ambulatory Visit: Payer: Self-pay

## 2023-03-06 ENCOUNTER — Encounter (HOSPITAL_COMMUNITY): Payer: Self-pay | Admitting: Cardiology

## 2023-03-06 DIAGNOSIS — I480 Paroxysmal atrial fibrillation: Secondary | ICD-10-CM

## 2023-03-06 DIAGNOSIS — Z91041 Radiographic dye allergy status: Secondary | ICD-10-CM

## 2023-03-06 MED ORDER — PREDNISONE 50 MG PO TABS: ORAL_TABLET | ORAL | 0 refills | Status: DC

## 2023-03-06 NOTE — Telephone Encounter (Signed)
The patient contacted the office because she spoke with a pharmacist yesterday from the hospital and there was a discussion in regards to her needing pre medication due to a contrast allergy. After this conversation the pharmacist added contrast to the patient's allergy list.   Per the patient, she states that at her appointment with Kathyrn Drown NP on 3/4 she made her aware that she developed nausea, vomiting and abdominal pain for 5 days after having 11/6 CT scan.  The pt states that her stomach felt like it was on fire.  I advised the patient that we can treat her with benadryl and prednisone prior to her LAAO procedure tomorrow.  I will send in Rx for prednisone 50mg  to be take at 8:30 PM tonight and 2:30 AM tomorrow morning and update her hospital orders for on call prednisone and benadryl. Pt agreed with plan and will pick up Rx.

## 2023-03-07 ENCOUNTER — Inpatient Hospital Stay (HOSPITAL_COMMUNITY): Payer: Medicare Other

## 2023-03-07 ENCOUNTER — Inpatient Hospital Stay (HOSPITAL_COMMUNITY)
Admission: RE | Disposition: A | Discharge status: Home / Self Care | Payer: Self-pay | Source: Ambulatory Visit | Attending: Cardiology

## 2023-03-07 ENCOUNTER — Inpatient Hospital Stay (HOSPITAL_COMMUNITY): Payer: Medicare Other | Admitting: Physician Assistant

## 2023-03-07 ENCOUNTER — Encounter (HOSPITAL_COMMUNITY): Payer: Self-pay | Admitting: Cardiology

## 2023-03-07 ENCOUNTER — Inpatient Hospital Stay (HOSPITAL_COMMUNITY)
Admission: RE | Admit: 2023-03-07 | Discharge: 2023-03-07 | DRG: 274 | Disposition: A | Discharge status: Home / Self Care | Payer: Medicare Other | Source: Ambulatory Visit | Attending: Cardiology | Admitting: Cardiology

## 2023-03-07 DIAGNOSIS — Z79899 Other long term (current) drug therapy: Secondary | ICD-10-CM | POA: Diagnosis not present

## 2023-03-07 DIAGNOSIS — Z885 Allergy status to narcotic agent status: Secondary | ICD-10-CM | POA: Diagnosis not present

## 2023-03-07 DIAGNOSIS — I480 Paroxysmal atrial fibrillation: Secondary | ICD-10-CM

## 2023-03-07 DIAGNOSIS — I4891 Unspecified atrial fibrillation: Secondary | ICD-10-CM

## 2023-03-07 DIAGNOSIS — I130 Hypertensive heart and chronic kidney disease with heart failure and stage 1 through stage 4 chronic kidney disease, or unspecified chronic kidney disease: Secondary | ICD-10-CM | POA: Diagnosis present

## 2023-03-07 DIAGNOSIS — I081 Rheumatic disorders of both mitral and tricuspid valves: Secondary | ICD-10-CM | POA: Diagnosis not present

## 2023-03-07 DIAGNOSIS — Z882 Allergy status to sulfonamides status: Secondary | ICD-10-CM

## 2023-03-07 DIAGNOSIS — Z8249 Family history of ischemic heart disease and other diseases of the circulatory system: Secondary | ICD-10-CM

## 2023-03-07 DIAGNOSIS — E7801 Familial hypercholesterolemia: Secondary | ICD-10-CM | POA: Diagnosis present

## 2023-03-07 DIAGNOSIS — Z91041 Radiographic dye allergy status: Secondary | ICD-10-CM | POA: Diagnosis not present

## 2023-03-07 DIAGNOSIS — E785 Hyperlipidemia, unspecified: Secondary | ICD-10-CM | POA: Diagnosis present

## 2023-03-07 DIAGNOSIS — Z881 Allergy status to other antibiotic agents status: Secondary | ICD-10-CM

## 2023-03-07 DIAGNOSIS — Z01818 Encounter for other preprocedural examination: Secondary | ICD-10-CM | POA: Diagnosis not present

## 2023-03-07 DIAGNOSIS — Z7901 Long term (current) use of anticoagulants: Secondary | ICD-10-CM | POA: Diagnosis not present

## 2023-03-07 DIAGNOSIS — Z006 Encounter for examination for normal comparison and control in clinical research program: Secondary | ICD-10-CM

## 2023-03-07 DIAGNOSIS — K219 Gastro-esophageal reflux disease without esophagitis: Secondary | ICD-10-CM | POA: Diagnosis present

## 2023-03-07 DIAGNOSIS — Z888 Allergy status to other drugs, medicaments and biological substances status: Secondary | ICD-10-CM

## 2023-03-07 DIAGNOSIS — E1122 Type 2 diabetes mellitus with diabetic chronic kidney disease: Secondary | ICD-10-CM | POA: Diagnosis not present

## 2023-03-07 DIAGNOSIS — K449 Diaphragmatic hernia without obstruction or gangrene: Secondary | ICD-10-CM | POA: Diagnosis not present

## 2023-03-07 DIAGNOSIS — I10 Essential (primary) hypertension: Secondary | ICD-10-CM | POA: Diagnosis present

## 2023-03-07 DIAGNOSIS — M810 Age-related osteoporosis without current pathological fracture: Secondary | ICD-10-CM | POA: Diagnosis present

## 2023-03-07 DIAGNOSIS — N182 Chronic kidney disease, stage 2 (mild): Secondary | ICD-10-CM | POA: Diagnosis present

## 2023-03-07 DIAGNOSIS — Z809 Family history of malignant neoplasm, unspecified: Secondary | ICD-10-CM | POA: Diagnosis not present

## 2023-03-07 HISTORY — PX: LEFT ATRIAL APPENDAGE OCCLUSION: EP1229

## 2023-03-07 HISTORY — PX: TEE WITHOUT CARDIOVERSION: SHX5443

## 2023-03-07 LAB — SURGICAL PCR SCREEN
MRSA, PCR: POSITIVE — AB
Staphylococcus aureus: POSITIVE — AB

## 2023-03-07 LAB — TYPE AND SCREEN
ABO/RH(D): O NEG
Antibody Screen: NEGATIVE

## 2023-03-07 LAB — POCT ACTIVATED CLOTTING TIME: Activated Clotting Time: 336 seconds

## 2023-03-07 LAB — ABO/RH: ABO/RH(D): O NEG

## 2023-03-07 LAB — ECHO TEE

## 2023-03-07 SURGERY — LEFT ATRIAL APPENDAGE OCCLUSION
Anesthesia: General

## 2023-03-07 MED ORDER — HEPARIN (PORCINE) IN NACL 1000-0.9 UT/500ML-% IV SOLN
INTRAVENOUS | Status: DC | PRN
  Administered 2023-03-07: 500 mL

## 2023-03-07 MED ORDER — HEPARIN SODIUM (PORCINE) 1000 UNIT/ML IJ SOLN
INTRAMUSCULAR | Status: DC | PRN
  Administered 2023-03-07: 11000 [IU] via INTRAVENOUS

## 2023-03-07 MED ORDER — PHENYLEPHRINE HCL-NACL 20-0.9 MG/250ML-% IV SOLN
INTRAVENOUS | Status: DC | PRN
  Administered 2023-03-07: 40 ug/min via INTRAVENOUS

## 2023-03-07 MED ORDER — DEXAMETHASONE SODIUM PHOSPHATE 10 MG/ML IJ SOLN
INTRAMUSCULAR | Status: DC | PRN
  Administered 2023-03-07: 10 mg via INTRAVENOUS

## 2023-03-07 MED ORDER — LACTATED RINGERS IV SOLN: INTRAVENOUS | Status: DC | PRN

## 2023-03-07 MED ORDER — PREDNISONE 20 MG PO TABS
50.0000 mg | ORAL_TABLET | ORAL | Status: AC
  Administered 2023-03-07: 50 mg via ORAL
  Filled 2023-03-07: qty 3

## 2023-03-07 MED ORDER — SODIUM CHLORIDE 0.9% FLUSH: 3.0000 mL | INTRAVENOUS | Status: DC | PRN

## 2023-03-07 MED ORDER — HEPARIN (PORCINE) IN NACL 2000-0.9 UNIT/L-% IV SOLN
INTRAVENOUS | Status: DC | PRN
  Administered 2023-03-07: 1000 mL

## 2023-03-07 MED ORDER — CHLORHEXIDINE GLUCONATE 4 % EX LIQD: Freq: Once | CUTANEOUS | Status: DC

## 2023-03-07 MED ORDER — FENTANYL CITRATE (PF) 250 MCG/5ML IJ SOLN
INTRAMUSCULAR | Status: DC | PRN
  Administered 2023-03-07: 100 ug via INTRAVENOUS

## 2023-03-07 MED ORDER — SODIUM CHLORIDE 0.9 % IV SOLN: INTRAVENOUS | Status: DC

## 2023-03-07 MED ORDER — ACETAMINOPHEN 325 MG PO TABS: 650.0000 mg | ORAL_TABLET | ORAL | Status: DC | PRN

## 2023-03-07 MED ORDER — PROTAMINE SULFATE 10 MG/ML IV SOLN
INTRAVENOUS | Status: DC | PRN
  Administered 2023-03-07: 30 mg via INTRAVENOUS

## 2023-03-07 MED ORDER — SODIUM CHLORIDE 0.9 % IV SOLN: 250.0000 mL | INTRAVENOUS | Status: DC | PRN

## 2023-03-07 MED ORDER — ONDANSETRON HCL 4 MG/2ML IJ SOLN: 4.0000 mg | Freq: Four times a day (QID) | INTRAMUSCULAR | Status: DC | PRN

## 2023-03-07 MED ORDER — SODIUM CHLORIDE 0.9% FLUSH
3.0000 mL | Freq: Two times a day (BID) | INTRAVENOUS | Status: DC
  Administered 2023-03-07: 3 mL via INTRAVENOUS

## 2023-03-07 MED ORDER — ROCURONIUM BROMIDE 10 MG/ML (PF) SYRINGE
PREFILLED_SYRINGE | INTRAVENOUS | Status: DC | PRN
  Administered 2023-03-07: 40 mg via INTRAVENOUS

## 2023-03-07 MED ORDER — IOHEXOL 350 MG/ML SOLN
INTRAVENOUS | Status: DC | PRN
  Administered 2023-03-07: 10 mL

## 2023-03-07 MED ORDER — CHLORHEXIDINE GLUCONATE 0.12 % MT SOLN
OROMUCOSAL | Status: AC
  Administered 2023-03-07: 15 mL via OROMUCOSAL
  Filled 2023-03-07: qty 15

## 2023-03-07 MED ORDER — PROPOFOL 10 MG/ML IV BOLUS
INTRAVENOUS | Status: DC | PRN
  Administered 2023-03-07: 150 mg via INTRAVENOUS
  Administered 2023-03-07: 50 mg via INTRAVENOUS

## 2023-03-07 MED ORDER — SUGAMMADEX SODIUM 200 MG/2ML IV SOLN
INTRAVENOUS | Status: DC | PRN
  Administered 2023-03-07: 200 mg via INTRAVENOUS

## 2023-03-07 MED ORDER — ACETAMINOPHEN 500 MG PO TABS
ORAL_TABLET | ORAL | Status: AC
  Administered 2023-03-07: 1000 mg via ORAL
  Filled 2023-03-07: qty 2

## 2023-03-07 MED ORDER — PHENYLEPHRINE 80 MCG/ML (10ML) SYRINGE FOR IV PUSH (FOR BLOOD PRESSURE SUPPORT)
PREFILLED_SYRINGE | INTRAVENOUS | Status: DC | PRN
  Administered 2023-03-07 (×2): 80 ug via INTRAVENOUS

## 2023-03-07 MED ORDER — CHLORHEXIDINE GLUCONATE 0.12 % MT SOLN
15.0000 mL | OROMUCOSAL | Status: AC
  Filled 2023-03-07: qty 15

## 2023-03-07 MED ORDER — CEFAZOLIN SODIUM-DEXTROSE 2-4 GM/100ML-% IV SOLN
2.0000 g | INTRAVENOUS | Status: AC
  Administered 2023-03-07: 2 g via INTRAVENOUS
  Filled 2023-03-07: qty 100

## 2023-03-07 MED ORDER — ACETAMINOPHEN 500 MG PO TABS: 1000.0000 mg | ORAL_TABLET | ORAL | Status: AC

## 2023-03-07 MED ORDER — ONDANSETRON HCL 4 MG/2ML IJ SOLN
INTRAMUSCULAR | Status: DC | PRN
  Administered 2023-03-07: 4 mg via INTRAVENOUS

## 2023-03-07 MED ORDER — DIPHENHYDRAMINE HCL 25 MG PO CAPS
50.0000 mg | ORAL_CAPSULE | ORAL | Status: AC
  Administered 2023-03-07: 50 mg via ORAL
  Filled 2023-03-07: qty 2

## 2023-03-07 MED ORDER — FENTANYL CITRATE (PF) 100 MCG/2ML IJ SOLN
INTRAMUSCULAR | Status: AC
  Filled 2023-03-07: qty 2

## 2023-03-07 SURGICAL SUPPLY — 18 items
CATH INFINITI 5FR ANG PIGTAIL (CATHETERS) IMPLANT
CLOSURE PERCLOSE PROSTYLE (VASCULAR PRODUCTS) IMPLANT
DEVICE WATCHMAN FLX PROC (KITS) IMPLANT
DILATOR VESSEL 38 20CM 14FR (INTRODUCER) IMPLANT
KIT HEART LEFT (KITS) ×1 IMPLANT
KIT SHEA VERSACROSS LAAC CONNE (KITS) IMPLANT
PACK CARDIAC CATHETERIZATION (CUSTOM PROCEDURE TRAY) ×1 IMPLANT
PAD DEFIB RADIO PHYSIO CONN (PAD) ×1 IMPLANT
SHEATH PERFORMER 16FR 30 (SHEATH) IMPLANT
SHEATH PINNACLE 8F 10CM (SHEATH) IMPLANT
SHEATH PROBE COVER 6X72 (BAG) ×1 IMPLANT
SYS WATCHMAN FXD DBL (SHEATH) ×1
SYSTEM WATCHMAN FXD DBL (SHEATH) IMPLANT
TRANSDUCER W/STOPCOCK (MISCELLANEOUS) ×1 IMPLANT
TUBING CIL FLEX 10 FLL-RA (TUBING) ×1 IMPLANT
WATCHMAN FLX 24 (Prosthesis & Implant Heart) IMPLANT
WATCHMAN FLX PROCEDURE DEVICE (KITS) ×1 IMPLANT
WATCHMAN PROCED TRUSEAL ACCESS (SHEATH) IMPLANT

## 2023-03-07 NOTE — Progress Notes (Signed)
  [place] TEAM  Patient doing well s/p LAAO closure. She is hemodynamically stable. Groin sites stable. Plan for early ambulation after bedrest completed and hopeful discharge later today.   Kathyrn Drown NP-C Structural Heart Team  Pager: [phone] Phone: [phone]

## 2023-03-07 NOTE — Discharge Summary (Signed)
[facility]    Patient ID: Mary Riley,  MRN: [id number], DOB/AGE: Apr 02, 1946 77 y.o.  Admit date: 03/07/2023 Discharge date: 03/07/2023  Primary Care Physician: Harlan Stains, MD  Primary Cardiologist: Jenkins Rouge, MD  Electrophysiologist: Vickie Epley, MD  Primary Discharge Diagnosis:  Paroxysmal Atrial Fibrillation Poor candidacy for long term anticoagulation due to refusal of long-term oral anticoagulation  Procedures This Admission:  Transeptal Puncture Intra-procedural TEE which showed no LAA thrombus Left atrial appendage occlusive device placement on 03/07/23 by Dr. Quentin Ore.   CONCLUSIONS:  1.Successful implantation of a WATCHMAN left atrial appendage occlusive device    2. TEE demonstrating no LAA thrombus 3. No early apparent complications.    Post Implant Anticoagulation Strategy: Continue Xarelto for 45 days after implant. After 45 days, stop Xarelto and start Plavix 75mg  PO daily to complete 6 months of post implant therapy. Plan for CT scan 60 days after implant to assess Watchman position.   Brief HPI: Mary Riley is a 77 y.o. female with a history of paroxysmal atrial fibrillation, CKD stage III, GERD, HTN, and hiatal hernia.    She was referred to Dr. Quentin Ore for the evaluation of [place] closure with Watchman. In consultation she was noted to be quite tachycardic therefore labs were performed that were reassuring. She was seen back several weeks later with HR returning to normal range. Given this, plan was to proceed with Watchman workup. CT imaging showed anatomy suitable for implant and was scheduled for 03/07/23.   Hospital Course:  The patient was admitted and underwent left atrial appendage occlusive device placement with Watchman FLX 6mm device.  She was monitored post procedure and has been doing well. Given this, she is considered for same day discharge today. Groin site has been without complication.  Wound care and restrictions were  reviewed with the patient. The patient has been scheduled for post procedure follow up with Kathyrn Drown, NP in approximately 1 month. Continue Xarelto for 45 days after implant (04/21/23). After 45 days, stop Xarelto and start Plavix 75mg  PO daily to complete 6 months of post implant therapy. Plan for CT scan 60 days after implant to assess Watchman position.  Physical Exam: Vitals:   03/07/23 1047 03/07/23 1055 03/07/23 1100 03/07/23 1131  BP: 135/74 132/66 138/74   Pulse: 83 86 84   Resp: 17 18 (!) 26   Temp:    97.6 F (36.4 C)  TempSrc:    Oral  SpO2: 98% 98% 99%   Weight:      Height:       Labs:   Lab Results  Component Value Date   WBC 8.1 02/18/2023   HGB 13.1 02/18/2023   HCT 40.1 02/18/2023   MCV 94 02/18/2023   PLT 327 02/18/2023   No results for input(s): "NA", "K", "CL", "CO2", "BUN", "CREATININE", "CALCIUM", "PROT", "BILITOT", "ALKPHOS", "ALT", "AST", "GLUCOSE" in the last 168 hours.  Invalid input(s): "LABALBU"   Discharge Medications:  Allergies as of 03/07/2023       Reactions   Dilaudid [hydromorphone Hcl] Nausea And Vomiting   Ace Inhibitors Cough   Codeine Nausea Only   Crestor [rosuvastatin] Other (See Comments)   Joints hurt   Demerol [meperidine] Nausea Only   Lipitor [atorvastatin] Other (See Comments)   Joints hurt   Iodinated Contrast Media Nausea And Vomiting   Abdominal pain   Biaxin [clarithromycin] Rash   Other Rash   EKG leads   Sulfa Antibiotics Rash  Zithromax [azithromycin] Rash        Medication List     TAKE these medications    acetaminophen 500 MG tablet Commonly known as: TYLENOL Take 500 mg by mouth every 6 (six) hours as needed.   albuterol 108 (90 Base) MCG/ACT inhaler Commonly known as: VENTOLIN HFA Inhale 1 puff into the lungs every 6 (six) hours as needed for wheezing or shortness of breath.   amLODipine 5 MG tablet Commonly known as: NORVASC Take 1 tablet (5 mg total) by mouth daily.   cetirizine 10 MG  tablet Commonly known as: ZYRTEC Take 10 mg by mouth daily.   Excedrin Extra Strength 250-250-65 MG tablet Generic drug: aspirin-acetaminophen-caffeine Take 2 tablets by mouth every 6 (six) hours as needed for headache.   ezetimibe 10 MG tablet Commonly known as: ZETIA Take 10 mg by mouth daily.   fluticasone 50 MCG/ACT nasal spray Commonly known as: FLONASE Place 1 spray into both nostrils daily.   metoprolol tartrate 50 MG tablet Commonly known as: LOPRESSOR TAKE 1 TABLET(50 MG) BY MOUTH TWICE DAILY   montelukast 10 MG tablet Commonly known as: SINGULAIR Take 10 mg by mouth daily.   pantoprazole 40 MG tablet Commonly known as: PROTONIX Take 40 mg by mouth daily.   Potassium 99 MG Tabs Take 1 tablet by mouth daily.   predniSONE 50 MG tablet Commonly known as: DELTASONE Take one tablet by mouth at 8:30 PM tonight and 2:30 AM tomorrow morning.   PROBIOTIC PO Take 1 capsule by mouth daily.   Repatha SureClick [id number] MG/ML Soaj Generic drug: Evolocumab Inject 140 mg into the skin every 14 (fourteen) days.   rivaroxaban 20 MG Tabs tablet Commonly known as: Xarelto TAKE 1 TABLET(20 MG) BY MOUTH DAILY WITH SUPPER Notes to patient: Restart tonight, 03/07/23        Disposition:  Home  Discharge Instructions     Call MD for:  difficulty breathing, headache or visual disturbances   Complete by: As directed    Call MD for:  extreme fatigue   Complete by: As directed    Call MD for:  hives   Complete by: As directed    Call MD for:  persistant dizziness or light-headedness   Complete by: As directed    Call MD for:  persistant nausea and vomiting   Complete by: As directed    Call MD for:  redness, tenderness, or signs of infection (pain, swelling, redness, odor or green/yellow discharge around incision site)   Complete by: As directed    Call MD for:  severe uncontrolled pain   Complete by: As directed    Call MD for:  temperature >100.4   Complete by: As  directed    Diet - low sodium heart healthy   Complete by: As directed    Discharge instructions   Complete by: As directed    [facility] Procedure, Care After  Procedure MD: Dr. Benson Norway Clinical Coordinator: Lenice Llamas, RN  This sheet gives you information about how to care for yourself after your procedure. Your health care provider may also give you more specific instructions. If you have problems or questions, contact your health care provider.  What can I expect after the procedure? After the procedure, it is common to have: Bruising around your puncture site. Tenderness around your puncture site. Tiredness (fatigue).  Medication instructions It is very important to continue to take your blood thinner as directed by your doctor after the Watchman procedure. Call your  procedure doctor's office with question or concerns. If you are on Coumadin (warfarin), you will have your INR checked the week after your procedure, with a goal INR of 2.0 - 3.0. Please follow your medication instructions on your discharge summary. Only take the medications listed on your discharge paperwork.  Follow up You will be seen in 1 month after your procedure You will have a repeat CT scan approximately 8 weeks after your procedure mark to check your device You will follow up the MD/APP who performed your procedure 6 months after your procedure The Watchman Clinical Coordinator will check in with you from time to time, including 1 and 2 years after your procedure.    Follow these instructions at home: Puncture site care  Follow instructions from your health care provider about how to take care of your puncture site. Make sure you: If present, leave stitches (sutures), skin glue, or adhesive strips in place.  If a large square bandage is present, this may be removed 24 hours after surgery.  Check your puncture site every day for signs of infection. Check for: Redness, swelling, or pain. Fluid or  blood. If your puncture site starts to bleed, lie down on your back, apply firm pressure to the area, and contact your health care provider. Warmth. Pus or a bad smell. Driving Do not drive yourself home if you received sedation Do not drive for at least 4 days after your procedure or however long your health care provider recommends. (Do not resume driving if you have previously been instructed not to drive for other health reasons.) Do not spend greater than 1 hour at a time in a car for the first 3 days. Stop and take a break with a 5 minute walk at least every hour.  Do not drive or use heavy machinery while taking prescription pain medicine.  Activity Avoid activities that take a lot of effort, including exercise, for at least 7 days after your procedure. For the first 3 days, avoid sitting for longer than one hour at a time.  Avoid alcoholic beverages, signing paperwork, or participating in legal proceedings for 24 hours after receiving sedation Do not lift anything that is heavier than 10 lb (4.5 kg) for one week.  No sexual activity for 1 week.  Return to your normal activities as told by your health care provider. Ask your health care provider what activities are safe for you. General instructions Take over-the-counter and prescription medicines only as told by your health care provider. Do not use any products that contain nicotine or tobacco, such as cigarettes and e-cigarettes. If you need help quitting, ask your health care provider. You may shower after 24 hours, but Do not take baths, swim, or use a hot tub for 1 week.  Do not drink alcohol for 24 hours after your procedure. Keep all follow-up visits as told by your health care provider. This is important. Dental Work: You will require antibiotics prior to any dental work, including cleanings, for 6 months after your Watchman implantation to help protect you from infection. After 6 months, antibiotics are no longer  required. Contact a health care provider if: You have redness, mild swelling, or pain around your puncture site. You have soreness in your throat or at your puncture site that does not improve after several days You have fluid or blood coming from your puncture site that stops after applying firm pressure to the area. Your puncture site feels warm to the touch. You have  pus or a bad smell coming from your puncture site. You have a fever. You have chest pain or discomfort that spreads to your neck, jaw, or arm. You are sweating a lot. You feel nauseous. You have a fast or irregular heartbeat. You have shortness of breath. You are dizzy or light-headed and feel the need to lie down. You have pain or numbness in the arm or leg closest to your puncture site. Get help right away if: Your puncture site suddenly swells. Your puncture site is bleeding and the bleeding does not stop after applying firm pressure to the area. These symptoms may represent a serious problem that is an emergency. Do not wait to see if the symptoms will go away. Get medical help right away. Call your local emergency services (911 in the [place] Do not drive yourself to the hospital. Summary After the procedure, it is normal to have bruising and tenderness at the puncture site in your groin, neck, or forearm. Check your puncture site every day for signs of infection. Get help right away if your puncture site is bleeding and the bleeding does not stop after applying firm pressure to the area. This is a medical emergency.  This information is not intended to replace advice given to you by your health care provider. Make sure you discuss any questions you have with your health care provider.   Increase activity slowly   Complete by: As directed        Follow-up Information     Tommie Raymond, NP Follow up on 04/15/2023.   Specialty: Cardiology Why: @ 1045am. Please arrive at 1030am. Contact information: [address] [place] [address] [phone]                 Duration of Discharge Encounter: Greater than 30 minutes including physician time.  Signed, Kathyrn Drown, NP  03/07/2023 3:06 PM

## 2023-03-07 NOTE — Transfer of Care (Signed)
Immediate Anesthesia Transfer of Care Note  Patient: Mary Riley  Procedure(s) Performed: LEFT ATRIAL APPENDAGE OCCLUSION TRANSESOPHAGEAL ECHOCARDIOGRAM (TEE)  Patient Location: PACU and Cath Lab  Anesthesia Type:General  Level of Consciousness: awake, alert , and oriented  Airway & Oxygen Therapy: Patient Spontanous Breathing and Patient connected to nasal cannula oxygen  Post-op Assessment: Report given to RN and Post -op Vital signs reviewed and stable  Post vital signs: Reviewed and stable  Last Vitals:  Vitals Value Taken Time  BP 135/74 03/07/23 1045  Temp 36.7 C 03/07/23 1046  Pulse 83 03/07/23 1051  Resp 17 03/07/23 1051  SpO2 98 % 03/07/23 1051  Vitals shown include unvalidated device data.  Last Pain:  Vitals:   03/07/23 1046  TempSrc: Temporal  PainSc: 0-No pain      Patients Stated Pain Goal: 0 ([id number])  Complications: There were no known notable events for this encounter.

## 2023-03-07 NOTE — Anesthesia Postprocedure Evaluation (Signed)
Anesthesia Post Note  Patient: Mary Riley  Procedure(s) Performed: LEFT ATRIAL APPENDAGE OCCLUSION TRANSESOPHAGEAL ECHOCARDIOGRAM (TEE)     Patient location during evaluation: PACU Anesthesia Type: General Level of consciousness: awake and alert Pain management: pain level controlled Vital Signs Assessment: post-procedure vital signs reviewed and stable Respiratory status: spontaneous breathing, nonlabored ventilation, respiratory function stable and patient connected to nasal cannula oxygen Cardiovascular status: blood pressure returned to baseline and stable Postop Assessment: no apparent nausea or vomiting Anesthetic complications: no  There were no known notable events for this encounter.  Last Vitals:  Vitals:   03/07/23 1100 03/07/23 1131  BP: 138/74   Pulse: 84   Resp: (!) 26   Temp:  36.4 C  SpO2: 99%     Last Pain:  Vitals:   03/07/23 1131  TempSrc: Oral  PainSc:                  Tiajuana Amass

## 2023-03-07 NOTE — Progress Notes (Signed)
Went over discharge paperwork with patient and family. All questions answered, PIV removed and all belongings at beside.

## 2023-03-07 NOTE — Anesthesia Procedure Notes (Signed)
Procedure Name: Intubation Date/Time: 03/07/2023 9:41 AM  Performed by: Anastasio Auerbach, CRNAPre-anesthesia Checklist: Patient identified, Emergency Drugs available, Suction available and Patient being monitored Patient Re-evaluated:Patient Re-evaluated prior to induction Oxygen Delivery Method: Circle system utilized Preoxygenation: Pre-oxygenation with 100% oxygen Induction Type: IV induction Ventilation: Mask ventilation without difficulty Laryngoscope Size: Mac and 3 Grade View: Grade I Tube type: Oral Number of attempts: 1 Airway Equipment and Method: Stylet and Oral airway Placement Confirmation: ETT inserted through vocal cords under direct vision, positive ETCO2 and breath sounds checked- equal and bilateral Secured at: 21 cm Tube secured with: Tape Dental Injury: Teeth and Oropharynx as per pre-operative assessment

## 2023-03-07 NOTE — TOC Transition Note (Signed)
Transition of Care [facility]) - CM/SW Discharge Note   Patient Details  Name: Mary Riley MRN: [id number] Date of Birth: 1946-09-18  Transition of Care [facility] CM/SW Contact:  Pollie Friar, RN Phone Number: 03/07/2023, 3:34 PM   Clinical Narrative:    Pt is discharging home with self care. No needs per TOC.   Final next level of care: Home/Self Care Barriers to Discharge: No Barriers Identified   Patient Goals and CMS Choice      Discharge Placement                         Discharge Plan and Services Additional resources added to the After Visit Summary for                                       Social Determinants of Health (SDOH) Interventions SDOH Screenings   Tobacco Use: Low Risk  (03/07/2023)     Readmission Risk Interventions     No data to display

## 2023-03-07 NOTE — H&P (Signed)
Electrophysiology Office Note:     Date:  03/07/2023    ID:  Mary Riley [birth year], MRN [id number]   PCP:  Harlan Stains, MD   [facility] Cardiologist:  Jenkins Rouge, MD  [place] Electrophysiologist:  Vickie Epley, MD    Referring MD: Harlan Stains, MD    Chief Complaint: AF   History of Present Illness:     Mary Riley is a 77 y.o. female who presents for an evaluation of AF at the request of Dr Johnsie Cancel. Their medical history includes pAF diagnosis 2017, CKD3, GERD, HTN, hiatal hernia.   The patient last saw Dr Johnsie Cancel 10/09/2022 and expressed an interest in [place] as a mechanism to avoid long term exposure to [facility]. She has a history of aneurysmal disease (ophthalmic and splenic arteries).    Today she tells me that she has been feeling poorly for a couple weeks. Some shortness of breath and worsening fatigue. No signs of bleeding. No clear signs of HF.    Presents for LAAO today. Procedure reviewed.     Objective      Past Medical History:  Diagnosis Date   Abdominal pain Nov. 2014   Allergy     Aneurysm of splenic artery [facility]) Nov 2014    Ref by Dr. Arta Silence   Benign hematuria     Chronic kidney disease      stage 3   Dyslipidemia     GERD (gastroesophageal reflux disease)     History of Crohn's disease     Hypertension     Osteoporosis     Paroxysmal atrial fibrillation [facility])             Past Surgical History:  Procedure Laterality Date   ABDOMINAL HYSTERECTOMY       BILATERAL OOPHORECTOMY        d/t cyst   BREAST BIOPSY        benign   CATARACT EXTRACTION, BILATERAL       COLONOSCOPY   2006 and 2011    Dr. Sammuel Cooper   FOOT SURGERY Left 2012   KNEE SURGERY Right Aug. 2006   TUMOR EXCISION        from kidney      Current Medications: Active Medications      Current Meds  Medication Sig   albuterol (VENTOLIN HFA) 108 (90 Base) MCG/ACT inhaler Inhale 2 puffs into the lungs every 6 (six) hours as needed for wheezing or  shortness of breath.   amLODipine (NORVASC) 5 MG tablet Take 1 tablet (5 mg total) by mouth daily.   ezetimibe (ZETIA) 10 MG tablet Take 10 mg by mouth daily.   Lactobacillus Rhamnosus, GG, (CULTURELLE PO) Take 1 capsule by mouth every morning.   methocarbamol (ROBAXIN) 500 MG tablet Take 500 mg by mouth every 6 (six) hours. Back pain   metoprolol tartrate (LOPRESSOR) 50 MG tablet TAKE 1 TABLET(50 MG) BY MOUTH TWICE DAILY   montelukast (SINGULAIR) 10 MG tablet Take 10 mg by mouth daily.   pantoprazole (PROTONIX) 40 MG tablet Take 40 mg by mouth daily.   Potassium 99 MG TABS Take 1 tablet by mouth daily.   rivaroxaban (XARELTO) 20 MG TABS tablet TAKE 1 TABLET(20 MG) BY MOUTH DAILY WITH SUPPER        Allergies:   Dilaudid [hydromorphone hcl], Ace inhibitors, Codeine, Crestor [rosuvastatin], Demerol [meperidine], Lipitor [atorvastatin], Zithromax [azithromycin], Biaxin [clarithromycin], and Sulfa antibiotics    Social History  Socioeconomic History   Marital status: Married      Spouse name: Not on file   Number of children: Not on file   Years of education: Not on file   Highest education level: Not on file  Occupational History   Not on file  Tobacco Use   Smoking status: Never   Smokeless tobacco: Never  Substance and Sexual Activity   Alcohol use: Yes      Alcohol/week: 1.0 standard drink of alcohol      Types: 1 Glasses of wine per week   Drug use: No   Sexual activity: Not on file  Other Topics Concern   Not on file  Social History Narrative   Not on file    Social Determinants of Health    Financial Resource Strain: Not on file  Food Insecurity: Not on file  Transportation Needs: Not on file  Physical Activity: Not on file  Stress: Not on file  Social Connections: Not on file      Family History: The patient's family history includes Aneurysm in her mother and sister; Cancer in her brother and maternal grandmother; Heart disease in her sister; Heart  disease (age of onset: 55) in her mother; Hypertension in her mother.   ROS:   Please see the history of present illness.    All other systems reviewed and are negative.   EKGs/Labs/Other Studies Reviewed:     The following studies were reviewed today:   11/2019 echo EF 65 RV normal     10/22/2022 CT cardiac Morphology: [place] Morphology with two lobes (including one large posterior lobe) Ostial measurements: 24 mm X 19 mm Depth: 20 mm Notes on transseptal puncture: No evidence of PFO or ASD. Device Selection: An ostial deployment is needed to cover both lobes. 27 mm Device has sufficient depth and average ostial developments.   02/04/2016 EMS run sheet AF on ECG      ECG today shows sinus tachycardia with a rate of 125bpm.     Recent Labs: 11/14/2022: ALT WILL FOLLOW; BUN WILL FOLLOW; Creatinine, Ser WILL FOLLOW; Hemoglobin 13.2; Platelets 303; Potassium WILL FOLLOW; Sodium WILL FOLLOW; TSH WILL FOLLOW  Recent Lipid Panel Labs (Brief)          Component Value Date/Time    CHOL 259 (H) 02/03/2016 0421    TRIG 124 02/03/2016 0421    HDL 72 02/03/2016 0421    CHOLHDL 3.6 02/03/2016 0421    VLDL 25 02/03/2016 0421    LDLCALC 162 (H) 02/03/2016 0421        Physical Exam:     VS:  BP 144/73   Pulse 95   Ht 5' 2.5" (1.588 m)   Wt 156 lb (70.8 kg)   SpO2 98%   BMI 28.08 kg/m         Wt Readings from Last 3 Encounters:  11/14/22 156 lb (70.8 kg)  10/09/22 155 lb 9.6 oz (70.6 kg)  09/06/21 149 lb (67.6 kg)      GEN:  Well nourished, well developed in no acute distress HEENT: Normal NECK: No JVD; No carotid bruits LYMPHATICS: No lymphadenopathy CARDIAC: RRR, no murmurs, rubs, gallops RESPIRATORY:  Clear to auscultation without rales, wheezing or rhonchi  ABDOMEN: Soft, non-tender, non-distended MUSCULOSKELETAL:  No edema; No deformity  SKIN: Warm and dry NEUROLOGIC:  Alert and oriented x 3 PSYCHIATRIC:  Normal affect           Assessment ASSESSMENT:     1. PAF (  paroxysmal atrial fibrillation) ([place])   2. HTN (hypertension), malignant     PLAN:     In order of problems listed above:   #Paroxysmal AF Low burden of arrhythmia. Currently takes xarelto for stroke ppx. Interested in avoiding long term exposure to [facility] given associated bleeding risks.    --------------------   I have seen Mary Riley in the office today who is being considered for a Watchman left atrial appendage closure device. I believe they will benefit from this procedure given their history of atrial fibrillation, CHA2DS2-VASc score of 5 and unadjusted ischemic stroke rate of 7.2% per year. The patient's chart has been reviewed and I feel that they would be a candidate for short term oral anticoagulation after Watchman implant.    It is my belief that after undergoing a LAA closure procedure, Mary Riley will not need long term anticoagulation which eliminates anticoagulation side effects and major bleeding risk.    Procedural risks for the Watchman implant have been reviewed with the patient including a 0.5% risk of stroke, <1% risk of perforation and <1% risk of device embolization. Other risks include bleeding, vascular damage, tamponade, worsening renal function, and death. The patient understands these risk and wishes to proceed.       The published clinical data on the safety and effectiveness of WATCHMAN include but are not limited to the following: - Holmes DR, Mechele Claude, Sick P et al. for the PROTECT AF Investigators. Percutaneous closure of the left atrial appendage versus warfarin therapy for prevention of stroke in patients with atrial fibrillation: a randomised non-inferiority trial. Lancet 2009; 374: 534-42. Mechele Claude, Doshi SK, Abelardo Diesel D et al. on behalf of the PROTECT AF Investigators. Percutaneous Left Atrial Appendage Closure for Stroke Prophylaxis in Patients With Atrial Fibrillation 2.3-Year Follow-up of the PROTECT AF  (Watchman Left Atrial Appendage System for Embolic Protection in Patients With Atrial Fibrillation) Trial. Circulation 2013; 127:720-729. - Alli O, Doshi S,  Kar S, Reddy VY, Sievert H et al. Quality of Life Assessment in the Randomized PROTECT AF (Percutaneous Closure of the Left Atrial Appendage Versus Warfarin Therapy for Prevention of Stroke in Patients With Atrial Fibrillation) Trial of Patients at Risk for Stroke With Nonvalvular Atrial Fibrillation. J Am Coll Cardiol 2013; P4788364. Vertell Limber DR, Tarri Abernethy, Price M, [place], Sievert H, Doshi S, Huber K, Reddy V. Prospective randomized evaluation of the Watchman left atrial appendage Device in patients with atrial fibrillation versus long-term warfarin therapy; the PREVAIL trial. Journal of the [employer] of Cardiology, Vol. 4, No. 1, 2014, 1-11. - Kar S, Doshi SK, Sadhu A, Horton R, Osorio J et al. Primary outcome evaluation of a next-generation left atrial appendage closure device: results from the PINNACLE FLX trial. Circulation 2021;143(18)1754-1762.      After today's visit with the patient which was dedicated solely for shared decision making visit regarding LAA closure device, the patient decided to proceed with the LAA appendage closure procedure scheduled to be done in the near future at [facility].     HAS-BLED score 2 Hypertension Yes  Abnormal renal and liver function (Dialysis, transplant, Cr >2.26 mg/dL /Cirrhosis or Bilirubin >2x Normal or AST/ALT/AP >3x Normal) No  Stroke No  Bleeding No  Labile INR (Unstable/high INR) No  Elderly (>65) Yes  Drugs or alcohol (? 8 drinks/week, anti-plt or NSAID) No    CHA2DS2-VASc Score = 5  The patient's score is based upon: CHF History: 0 HTN History: 1 Diabetes  History: 0 Stroke History: 0 Vascular Disease History: 1 Age Score: 2 Gender Score: 1      Presents for Watchman implant. Procedure reviewed.      Signed, Hilton Cork. Quentin Ore, MD, [facility],  [facility] 03/07/2023 Electrophysiology [facility]

## 2023-03-07 NOTE — Anesthesia Preprocedure Evaluation (Signed)
Anesthesia Evaluation  Patient identified by MRN, date of birth, ID band Patient awake    Reviewed: Allergy & Precautions, NPO status , Patient's Chart, lab work & pertinent test results  Airway Mallampati: II  TM Distance: >3 FB Neck ROM: Full    Dental  (+) Dental Advisory Given   Pulmonary neg pulmonary ROS   breath sounds clear to auscultation       Cardiovascular hypertension, Pt. on medications + dysrhythmias Atrial Fibrillation  Rhythm:Regular Rate:Normal     Neuro/Psych negative neurological ROS     GI/Hepatic Neg liver ROS,GERD  ,,  Endo/Other  negative endocrine ROS    Renal/GU CRFRenal disease     Musculoskeletal   Abdominal   Peds  Hematology negative hematology ROS (+)   Anesthesia Other Findings   Reproductive/Obstetrics                             Anesthesia Physical Anesthesia Plan  ASA: 2  Anesthesia Plan: General   Post-op Pain Management: Tylenol PO (pre-op)* and Minimal or no pain anticipated   Induction: Intravenous  PONV Risk Score and Plan: 3 and Dexamethasone, Ondansetron and Treatment may vary due to age or medical condition  Airway Management Planned: Oral ETT  Additional Equipment: ClearSight  Intra-op Plan:   Post-operative Plan: Extubation in OR  Informed Consent: I have reviewed the patients History and Physical, chart, labs and discussed the procedure including the risks, benefits and alternatives for the proposed anesthesia with the patient or authorized representative who has indicated his/her understanding and acceptance.     Dental advisory given  Plan Discussed with: CRNA  Anesthesia Plan Comments:        Anesthesia Quick Evaluation

## 2023-03-08 ENCOUNTER — Telehealth: Payer: Self-pay

## 2023-03-08 DIAGNOSIS — I480 Paroxysmal atrial fibrillation: Secondary | ICD-10-CM

## 2023-03-08 DIAGNOSIS — Z95818 Presence of other cardiac implants and grafts: Secondary | ICD-10-CM

## 2023-03-08 MED FILL — Fentanyl Citrate Preservative Free (PF) Inj 100 MCG/2ML: INTRAMUSCULAR | Qty: 2 | Status: AC

## 2023-03-08 NOTE — Telephone Encounter (Signed)
  [facility] Team  Contacted the patient regarding discharge from [facility] on 03/07/2023  The patient understands to follow up with Kathyrn Drown on 04/15/23 in preparation for imaging on 05/09/2023.  The patient understands discharge instructions? Yes  The patient understands medications and regimen? Yes   The patient reports groin site looks healthy with no S/S of infection or bleeding.  The patient understands to call with any questions or concerns prior to scheduled visit.

## 2023-03-20 DIAGNOSIS — I480 Paroxysmal atrial fibrillation: Secondary | ICD-10-CM | POA: Diagnosis not present

## 2023-03-20 DIAGNOSIS — G720 Drug-induced myopathy: Secondary | ICD-10-CM | POA: Diagnosis not present

## 2023-03-20 DIAGNOSIS — E785 Hyperlipidemia, unspecified: Secondary | ICD-10-CM | POA: Diagnosis not present

## 2023-03-20 DIAGNOSIS — I728 Aneurysm of other specified arteries: Secondary | ICD-10-CM | POA: Diagnosis not present

## 2023-03-20 DIAGNOSIS — D6869 Other thrombophilia: Secondary | ICD-10-CM | POA: Diagnosis not present

## 2023-03-20 DIAGNOSIS — I129 Hypertensive chronic kidney disease with stage 1 through stage 4 chronic kidney disease, or unspecified chronic kidney disease: Secondary | ICD-10-CM | POA: Diagnosis not present

## 2023-03-20 DIAGNOSIS — J3089 Other allergic rhinitis: Secondary | ICD-10-CM | POA: Diagnosis not present

## 2023-03-20 DIAGNOSIS — I671 Cerebral aneurysm, nonruptured: Secondary | ICD-10-CM | POA: Diagnosis not present

## 2023-03-20 DIAGNOSIS — N183 Chronic kidney disease, stage 3 unspecified: Secondary | ICD-10-CM | POA: Diagnosis not present

## 2023-03-20 DIAGNOSIS — J4520 Mild intermittent asthma, uncomplicated: Secondary | ICD-10-CM | POA: Diagnosis not present

## 2023-03-20 DIAGNOSIS — K219 Gastro-esophageal reflux disease without esophagitis: Secondary | ICD-10-CM | POA: Diagnosis not present

## 2023-03-21 ENCOUNTER — Other Ambulatory Visit: Payer: Self-pay | Admitting: Family Medicine

## 2023-03-21 DIAGNOSIS — I671 Cerebral aneurysm, nonruptured: Secondary | ICD-10-CM

## 2023-03-22 ENCOUNTER — Encounter: Payer: Self-pay | Admitting: Family Medicine

## 2023-03-22 DIAGNOSIS — I671 Cerebral aneurysm, nonruptured: Secondary | ICD-10-CM

## 2023-03-25 DIAGNOSIS — J019 Acute sinusitis, unspecified: Secondary | ICD-10-CM | POA: Diagnosis not present

## 2023-03-25 DIAGNOSIS — B9689 Other specified bacterial agents as the cause of diseases classified elsewhere: Secondary | ICD-10-CM | POA: Diagnosis not present

## 2023-03-25 DIAGNOSIS — J4520 Mild intermittent asthma, uncomplicated: Secondary | ICD-10-CM | POA: Diagnosis not present

## 2023-03-25 DIAGNOSIS — J3089 Other allergic rhinitis: Secondary | ICD-10-CM | POA: Diagnosis not present

## 2023-04-15 ENCOUNTER — Ambulatory Visit: Payer: Medicare Other | Attending: Cardiovascular Disease | Admitting: Cardiology

## 2023-04-15 VITALS — BP 118/70 | HR 75 | Ht 63.0 in | Wt 160.6 lb

## 2023-04-15 DIAGNOSIS — I480 Paroxysmal atrial fibrillation: Secondary | ICD-10-CM | POA: Diagnosis not present

## 2023-04-15 DIAGNOSIS — N182 Chronic kidney disease, stage 2 (mild): Secondary | ICD-10-CM

## 2023-04-15 DIAGNOSIS — Z91041 Radiographic dye allergy status: Secondary | ICD-10-CM | POA: Diagnosis not present

## 2023-04-15 DIAGNOSIS — Z01818 Encounter for other preprocedural examination: Secondary | ICD-10-CM | POA: Diagnosis not present

## 2023-04-15 DIAGNOSIS — Z95818 Presence of other cardiac implants and grafts: Secondary | ICD-10-CM

## 2023-04-15 MED ORDER — CLOPIDOGREL BISULFATE 75 MG PO TABS: 75.0000 mg | ORAL_TABLET | Freq: Every day | ORAL | 3 refills | Status: DC

## 2023-04-15 NOTE — Patient Instructions (Signed)
Medication Instructions:  Stop Xarelto on Saturday, May 5  Start Plavix 75 mg daily on Monday, May 6   *If you need a refill on your cardiac medications before your next appointment, please call your pharmacy*   Lab Work: Bmp, Cbc - today  If you have labs (blood work) drawn today and your tests are completely normal, you will receive your results only by: MyChart Message (if you have MyChart) OR A paper copy in the mail If you have any lab test that is abnormal or we need to change your treatment, we will call you to review the results.   Testing/Procedures: None ordered    Follow-Up: Follow up as scheduled   Other Instructions

## 2023-04-16 LAB — CBC
Hematocrit: 35.6 % (ref 34.0–46.6)
Hemoglobin: 11.8 g/dL (ref 11.1–15.9)
MCH: 29.9 pg (ref 26.6–33.0)
MCHC: 33.1 g/dL (ref 31.5–35.7)
MCV: 90 fL (ref 79–97)
Platelets: 323 10*3/uL (ref 150–450)
RBC: 3.95 x10E6/uL (ref 3.77–5.28)
RDW: 12.1 % (ref 11.7–15.4)
WBC: 7.9 10*3/uL (ref 3.4–10.8)

## 2023-04-16 LAB — BASIC METABOLIC PANEL
BUN/Creatinine Ratio: 17 (ref 12–28)
BUN: 19 mg/dL (ref 8–27)
CO2: 21 mmol/L (ref 20–29)
Calcium: 9.4 mg/dL (ref 8.7–10.3)
Chloride: 105 mmol/L (ref 96–106)
Creatinine, Ser: 1.12 mg/dL — ABNORMAL HIGH (ref 0.57–1.00)
Glucose: 87 mg/dL (ref 70–99)
Potassium: 4.5 mmol/L (ref 3.5–5.2)
Sodium: 142 mmol/L (ref 134–144)
eGFR: 51 mL/min/{1.73_m2} — ABNORMAL LOW (ref >59)

## 2023-04-16 MED ORDER — PREDNISONE 50 MG PO TABS: ORAL_TABLET | ORAL | 0 refills | Status: DC

## 2023-04-16 NOTE — Progress Notes (Signed)
[facility]                                     Cardiology Office Note:    Date:  04/16/2023   ID:  Mary Riley, Mary Riley Nov 30, 1946, MRN [id number]  PCP:  Laurann Montana, MD  [facility] Cardiologist:  Charlton Haws, MD  [facility] Electrophysiologist:  Lanier Prude, MD   Referring MD: Laurann Montana, MD   Chief Complaint  Patient presents with   Follow-up    S/p LAAO   History of Present Illness:    Mary Riley is a 77 y.o. female with a hx of paroxysmal atrial fibrillation, CKD stage III, GERD, HTN, and hiatal hernia.    She was referred to Dr. Lalla Brothers for the evaluation of LAAO closure with Watchman. In consultation she was noted to be quite tachycardic therefore labs were performed that were reassuring. She was seen back several weeks later with HR returning to normal range. Given this, plan was to proceed with Watchman workup. CT imaging showed anatomy suitable for implant and was scheduled for 03/07/23.   She is now s/p successful LAAO closure with Watchman FLX 24mm device. She was restarted on Xarelto with plans to continue for 45 days after implant (04/21/23). After 45 days, stop Xarelto and start Plavix 75mg  PO daily to complete 6 months of post implant therapy. Plan for CT scan 60 days after implant to assess Watchman position.  Today she is here alone and reports that she has been well since she was last seen. Denies issues with bleeding, chest pain, SOB, palpitations, LE edema, orthopnea, dizziness, or syncope.   Past Medical History:  Diagnosis Date   Abdominal pain 10/2013   Allergy    Aneurysm of splenic artery (HCC) 10/2013   Ref by Dr. Willis Modena   Benign hematuria    Chronic kidney disease    stage 3   Dyslipidemia    GERD (gastroesophageal reflux disease)    History of Crohn's disease    Hypertension    Osteoporosis    Paroxysmal atrial fibrillation [facility])     Past Surgical History:  Procedure Laterality Date   ABDOMINAL  HYSTERECTOMY     BILATERAL OOPHORECTOMY     d/t cyst   BREAST BIOPSY     benign   CATARACT EXTRACTION, BILATERAL     COLONOSCOPY  2006 and 2011   Dr. Sherin Quarry   FOOT SURGERY Left 2012   KNEE SURGERY Right Aug. 2006   LEFT ATRIAL APPENDAGE OCCLUSION N/A 03/07/2023   Procedure: LEFT ATRIAL APPENDAGE OCCLUSION;  Surgeon: Lanier Prude, MD;  Location: [facility] INVASIVE CV LAB;  Service: Cardiovascular;  Laterality: N/A;   TEE WITHOUT CARDIOVERSION N/A 03/07/2023   Procedure: TRANSESOPHAGEAL ECHOCARDIOGRAM (TEE);  Surgeon: Lanier Prude, MD;  Location: [facility] INVASIVE CV LAB;  Service: Cardiovascular;  Laterality: N/A;   TUMOR EXCISION     from kidney    Current Medications: Current Meds  Medication Sig   acetaminophen (TYLENOL) 500 MG tablet Take 500 mg by mouth every 6 (six) hours as needed.   albuterol (VENTOLIN HFA) 108 (90 Base) MCG/ACT inhaler Inhale 1 puff into the lungs every 6 (six) hours as needed for wheezing or shortness of breath.   amLODipine (NORVASC) 5 MG tablet Take 1 tablet (5 mg total) by mouth daily.   aspirin-acetaminophen-caffeine (EXCEDRIN EXTRA STRENGTH) [phone] MG tablet Take 2 tablets  by mouth every 6 (six) hours as needed for headache.   cetirizine (ZYRTEC) 10 MG tablet Take 10 mg by mouth daily.   clopidogrel (PLAVIX) 75 MG tablet Take 1 tablet (75 mg total) by mouth daily. Start on 04/22/23   Evolocumab (REPATHA SURECLICK) 140 MG/ML SOAJ Inject 140 mg into the skin every 14 (fourteen) days.   ezetimibe (ZETIA) 10 MG tablet Take 10 mg by mouth daily.   fluticasone (FLONASE) 50 MCG/ACT nasal spray Place 1 spray into both nostrils daily.   metoprolol tartrate (LOPRESSOR) 50 MG tablet TAKE 1 TABLET(50 MG) BY MOUTH TWICE DAILY   montelukast (SINGULAIR) 10 MG tablet Take 10 mg by mouth daily.   pantoprazole (PROTONIX) 40 MG tablet Take 40 mg by mouth daily.   Potassium 99 MG TABS Take 1 tablet by mouth daily.   predniSONE (DELTASONE) 50 MG tablet Take one 50mg   tablet by mouth on 5/22 at 1030pm; take second tablet on 5/23 at 430am, and take final tablet on 5/23 at 1030am.   Probiotic Product (PROBIOTIC PO) Take 1 capsule by mouth daily.   rivaroxaban (XARELTO) 20 MG TABS tablet TAKE 1 TABLET(20 MG) BY MOUTH DAILY WITH SUPPER (Patient taking differently: TAKE 1 TABLET(20 MG) BY MOUTH DAILY WITH SUPPER..... stop on 04/21/23)   [DISCONTINUED] predniSONE (DELTASONE) 50 MG tablet Take one tablet by mouth at 8:30 PM tonight and 2:30 AM tomorrow morning.     Allergies:   Dilaudid [hydromorphone hcl], Ace inhibitors, Codeine, Crestor [rosuvastatin], Demerol [meperidine], Lipitor [atorvastatin], Iodinated contrast media, Biaxin [clarithromycin], Other, Sulfa antibiotics, and Zithromax [azithromycin]   Social History   Socioeconomic History   Marital status: Widowed    Spouse name: Not on file   Number of children: Not on file   Years of education: Not on file   Highest education level: Not on file  Occupational History   Not on file  Tobacco Use   Smoking status: Never   Smokeless tobacco: Never  Substance and Sexual Activity   Alcohol use: Yes    Alcohol/week: 1.0 standard drink of alcohol    Types: 1 Glasses of wine per week   Drug use: No   Sexual activity: Not on file  Other Topics Concern   Not on file  Social History Narrative   Not on file   Social Determinants of Health   Financial Resource Strain: Not on file  Food Insecurity: Not on file  Transportation Needs: Not on file  Physical Activity: Not on file  Stress: Not on file  Social Connections: Not on file    Family History: The patient's family history includes Aneurysm in her mother and sister; Cancer in her brother and maternal grandmother; Heart disease in her sister; Heart disease (age of onset: 62) in her mother; Hypertension in her mother.  ROS:   Please see the history of present illness.    All other systems reviewed and are negative.  EKGs/Labs/Other Studies  Reviewed:    The following studies were reviewed today:  Procedures This Admission:  Transeptal Puncture Intra-procedural TEE which showed no LAA thrombus Left atrial appendage occlusive device placement on 03/07/23 by Dr. Lalla Brothers.    CONCLUSIONS:  1.Successful implantation of a WATCHMAN left atrial appendage occlusive device    2. TEE demonstrating no LAA thrombus 3. No early apparent complications.    Post Implant Anticoagulation Strategy: Continue Xarelto for 45 days after implant. After 45 days, stop Xarelto and start Plavix 75mg  PO daily to complete 6 months of post  implant therapy. Plan for CT scan 60 days after implant to assess Watchman position.  EKG:  EKG is not ordered today.    Recent Labs: 11/14/2022: TSH 2.870 02/18/2023: ALT 13 04/15/2023: BUN 19; Creatinine, Ser 1.12; Hemoglobin 11.8; Platelets 323; Potassium 4.5; Sodium 142   Recent Lipid Panel    Component Value Date/Time   CHOL 200 (H) 02/18/2023 1108   TRIG 128 02/18/2023 1108   HDL 88 02/18/2023 1108   CHOLHDL 2.3 02/18/2023 1108   CHOLHDL 3.6 02/03/2016 0421   VLDL 25 02/03/2016 0421   LDLCALC 90 02/18/2023 1108   Physical Exam:    VS:  BP 118/70   Pulse 75   Ht 5\' 3"  (1.6 m)   Wt 160 lb 9.6 oz (72.8 kg)   SpO2 98%   BMI 28.45 kg/m     Wt Readings from Last 3 Encounters:  04/15/23 160 lb 9.6 oz (72.8 kg)  03/07/23 159 lb 2.8 oz (72.2 kg)  02/18/23 160 lb 3.2 oz (72.7 kg)    General: Well developed, well nourished, NAD Lungs:Clear to ausculation bilaterally. No wheezes, rales, or rhonchi. Breathing is unlabored. Cardiovascular: RRR with S1 S2. No murmurs Extremities: No edema.  Neuro: Alert and oriented. No focal deficits. No facial asymmetry. MAE spontaneously. Psych: Responds to questions appropriately with normal affect.    ASSESSMENT/PLAN:    PAF: s/p successful LAAO closure with Watchman FLX 24mm device. She was restarted on Xarelto with plans to continue for 45 days through 04/21/23  (last dose). On 5/6 she will start Plavix 75mg  QD through 6 months (09/07/23). She will require dental SBE during that time however patient wishes to defer dental cleanings until after 6 months. Plan for CT scan 60 days after implant to assess Watchman position. CT instructions reviewed with understanding. Given contrast allergy, pre CT medications sent to pharmacy. Obtain BMET today. Plan 6 month f/u with our team.   CKD stage II: Baseline Cr appears to be in the 1.1 range. BMET today    HTN: Stable with no changes needed today.   Medication Adjustments/Labs and Tests Ordered: Current medicines are reviewed at length with the patient today.  Concerns regarding medicines are outlined above.  Orders Placed This Encounter  Procedures   Basic metabolic panel   CBC   Meds ordered this encounter  Medications   clopidogrel (PLAVIX) 75 MG tablet    Sig: Take 1 tablet (75 mg total) by mouth daily. Start on 04/22/23    Dispense:  90 tablet    Refill:  3   predniSONE (DELTASONE) 50 MG tablet    Sig: Take one 50mg  tablet by mouth on 5/22 at 1030pm; take second tablet on 5/23 at 430am, and take final tablet on 5/23 at 1030am.    Dispense:  3 tablet    Refill:  0    Order Specific Question:   Supervising Provider    Answer:   Tonny Bollman [[id number]]    Patient Instructions  Medication Instructions:  Stop Xarelto on Saturday, May 5  Start Plavix 75 mg daily on Monday, May 6   *If you need a refill on your cardiac medications before your next appointment, please call your pharmacy*   Lab Work: Bmp, Cbc - today  If you have labs (blood work) drawn today and your tests are completely normal, you will receive your results only by: MyChart Message (if you have MyChart) OR A paper copy in the mail If you have any lab test that is  abnormal or we need to change your treatment, we will call you to review the results.   Testing/Procedures: None ordered    Follow-Up: Follow up as scheduled    Other Instructions     Signed, Georgie Chard, NP  04/16/2023 9:53 AM    [facility]

## 2023-04-19 DIAGNOSIS — L298 Other pruritus: Secondary | ICD-10-CM | POA: Diagnosis not present

## 2023-04-19 DIAGNOSIS — L720 Epidermal cyst: Secondary | ICD-10-CM | POA: Diagnosis not present

## 2023-04-19 DIAGNOSIS — L814 Other melanin hyperpigmentation: Secondary | ICD-10-CM | POA: Diagnosis not present

## 2023-04-19 DIAGNOSIS — L821 Other seborrheic keratosis: Secondary | ICD-10-CM | POA: Diagnosis not present

## 2023-04-19 DIAGNOSIS — L820 Inflamed seborrheic keratosis: Secondary | ICD-10-CM | POA: Diagnosis not present

## 2023-04-19 DIAGNOSIS — D485 Neoplasm of uncertain behavior of skin: Secondary | ICD-10-CM | POA: Diagnosis not present

## 2023-04-19 DIAGNOSIS — L810 Postinflammatory hyperpigmentation: Secondary | ICD-10-CM | POA: Diagnosis not present

## 2023-05-08 ENCOUNTER — Telehealth (HOSPITAL_COMMUNITY): Payer: Self-pay | Admitting: *Deleted

## 2023-05-08 NOTE — Telephone Encounter (Signed)
Attempted to call patient regarding upcoming cardiac CT appointment. °Left message on voicemail with name and callback number ° °Aarit Kashuba RN Navigator Cardiac Imaging °[facility] °[phone] Office °[phone] Cell ° °

## 2023-05-09 ENCOUNTER — Ambulatory Visit (HOSPITAL_COMMUNITY)
Admission: RE | Admit: 2023-05-09 | Discharge: 2023-05-09 | Disposition: A | Discharge status: Home / Self Care | Payer: Medicare Other | Source: Ambulatory Visit | Attending: Cardiology | Admitting: Cardiology

## 2023-05-09 DIAGNOSIS — I480 Paroxysmal atrial fibrillation: Secondary | ICD-10-CM | POA: Insufficient documentation

## 2023-05-09 DIAGNOSIS — Z95818 Presence of other cardiac implants and grafts: Secondary | ICD-10-CM | POA: Diagnosis not present

## 2023-05-09 MED ORDER — IOHEXOL 350 MG/ML SOLN
95.0000 mL | Freq: Once | INTRAVENOUS | Status: AC | PRN
  Administered 2023-05-09: 95 mL via INTRAVENOUS

## 2023-05-09 NOTE — Progress Notes (Signed)
Patient states took all 13hr prep

## 2023-05-14 ENCOUNTER — Telehealth: Payer: Self-pay

## 2023-05-14 NOTE — Telephone Encounter (Signed)
Reviewed results with patient who verbalized understanding.   Confirmed the patient has STOPPED XARELTO and STARTED PLAVIX as instructed at her recent visit.  Confirmed 6 month Watchman visit 09/09/2023. She was grateful for call and agreed with plan.

## 2023-05-14 NOTE — Telephone Encounter (Signed)
-----   Message from Lanier Prude, MD sent at 05/13/2023  9:51 AM EDT ----- Sharyne Peach looks great. Appendage is well sealed. OK to continue with planned post implant medication strategy.   Sheria Lang T. Lalla Brothers, MD, [facility], [facility] Cardiac Electrophysiology

## 2023-06-03 ENCOUNTER — Telehealth: Payer: Self-pay | Admitting: Pharmacist

## 2023-06-03 ENCOUNTER — Telehealth: Payer: Self-pay

## 2023-06-03 ENCOUNTER — Other Ambulatory Visit (HOSPITAL_COMMUNITY): Payer: Self-pay

## 2023-06-03 NOTE — Telephone Encounter (Signed)
Pharmacy Patient Advocate Encounter  Prior Authorization for REPATHA 140 MG/ML INJ has been approved.    PA# [id number] Effective dates: 06/03/23 through 12/17/23  Haze Rushing, CPhT Pharmacy Patient Advocate Specialist Direct Number: [phone] Fax: [phone]

## 2023-06-03 NOTE — Telephone Encounter (Signed)
Renewal request for Repatha received via fax

## 2023-06-03 NOTE — Telephone Encounter (Signed)
Pharmacy Patient Advocate Encounter   Received notification from [facility] MEDIC[facility] that prior authorization for REPATHA 140 MG/ML INJ is needed.    PA submitted on 06/03/23 Key [id number] Status is pending  Haze Rushing, CPhT Pharmacy Patient Advocate Specialist Direct Number: [phone] Fax: [phone]

## 2023-06-03 NOTE — Telephone Encounter (Signed)
Called to inform about PA approval. N/A LVM

## 2023-06-03 NOTE — Telephone Encounter (Signed)
PA sent to plan, please see separate encounter for updates on determination. (I will route you back in once a decision has been made)  Timtohy Broski, CPhT Pharmacy Patient Advocate Specialist Direct Number: [phone] Fax: [phone]  

## 2023-06-06 DIAGNOSIS — M79671 Pain in right foot: Secondary | ICD-10-CM | POA: Diagnosis not present

## 2023-06-19 DIAGNOSIS — Z1231 Encounter for screening mammogram for malignant neoplasm of breast: Secondary | ICD-10-CM | POA: Diagnosis not present

## 2023-06-24 ENCOUNTER — Other Ambulatory Visit: Payer: Medicare Other

## 2023-07-04 DIAGNOSIS — M7741 Metatarsalgia, right foot: Secondary | ICD-10-CM | POA: Diagnosis not present

## 2023-07-08 ENCOUNTER — Other Ambulatory Visit (HOSPITAL_COMMUNITY): Payer: Self-pay | Admitting: Student

## 2023-07-08 DIAGNOSIS — M79671 Pain in right foot: Secondary | ICD-10-CM

## 2023-07-22 ENCOUNTER — Ambulatory Visit (HOSPITAL_COMMUNITY)
Admission: RE | Admit: 2023-07-22 | Discharge: 2023-07-22 | Disposition: A | Discharge status: Home / Self Care | Payer: Medicare Other | Source: Ambulatory Visit | Attending: Student | Admitting: Student

## 2023-07-22 DIAGNOSIS — M79671 Pain in right foot: Secondary | ICD-10-CM | POA: Diagnosis not present

## 2023-07-26 NOTE — Telephone Encounter (Signed)
Call patient to inform about Repatha PA approval. N/A

## 2023-07-30 DIAGNOSIS — H6691 Otitis media, unspecified, right ear: Secondary | ICD-10-CM | POA: Diagnosis not present

## 2023-08-23 DIAGNOSIS — G5761 Lesion of plantar nerve, right lower limb: Secondary | ICD-10-CM | POA: Diagnosis not present

## 2023-09-04 DIAGNOSIS — D225 Melanocytic nevi of trunk: Secondary | ICD-10-CM | POA: Diagnosis not present

## 2023-09-04 DIAGNOSIS — L821 Other seborrheic keratosis: Secondary | ICD-10-CM | POA: Diagnosis not present

## 2023-09-04 DIAGNOSIS — L814 Other melanin hyperpigmentation: Secondary | ICD-10-CM | POA: Diagnosis not present

## 2023-09-06 NOTE — Progress Notes (Unsigned)
IVC RV Basal diam:  3.60 cm     IVC diam: 0.90 cm RV S prime:     11.00 cm/s TAPSE (M-mode): 2.2 cm  LEFT ATRIUM             Index        RIGHT ATRIUM            Index LA diam:        4.60 cm 2.66 cm/m   RA Area:     11.40 cm LA Vol (A2C):   41.7 ml 24.10 ml/m  RA Volume:   26.50 ml  15.31 ml/m LA Vol (A4C):   72.6 ml 41.96 ml/m LA Biplane Vol: 58.3 ml 33.69 ml/m AORTIC VALVE LVOT Vmax:   98.50 cm/s LVOT Vmean:  63.800 cm/s LVOT VTI:    0.214 m  AORTA Ao Root diam: 2.90 cm Ao Asc diam:  3.20 cm  MITRAL VALVE                TRICUSPID VALVE MV Area (PHT): 4.23 cm     TR Peak grad:   17.0 mmHg MV Decel Time: 180 msec     TR Vmax:        206.00 cm/s MV E velocity: 111.00 cm/s MV A velocity: 47.15 cm/s   SHUNTS MV E/A ratio:  2.35         Systemic VTI:  0.21 m Systemic Diam: 1.80 cm  Chilton Si MD Electronically signed by Chilton Si MD Signature Date/Time: 12/06/2022/2:54:32 PM    Final   TEE  ECHO TEE 03/07/2023  Narrative TRANSESOPHOGEAL ECHO REPORT    Patient Name:   Mary Riley Date of Exam: 03/07/2023 Medical Rec #:  [id number]    Height:       63.0 in Accession #:    [id number]   Weight:       159.2 lb Date of Birth:  May 18, 1946    BSA:          1.755 m Patient Age:    77 years     BP:           144/73 mmHg Patient Gender: F            HR:           99 bpm. Exam Location:  Inpatient  Procedure: Transesophageal Echo, 3D Echo, Color Doppler and Cardiac Doppler  Indications:     A-Fib  History:         Patient has prior history of Echocardiogram examinations, most recent 12/06/2022. Arrythmias:Atrial Fibrillation; Risk Factors:Hypertension and Dyslipidemia.  Sonographer:     Irving Burton Senior RDCS Referring Phys:  [id number] Lanier Prude Diagnosing Phys: Charlton Haws MD   Sonographer Comments: Watchman Procedure   PROCEDURE: After discussion of the risks and benefits of a TEE, an informed consent was obtained from the patient. The transesophogeal probe was passed without difficulty through the esophogus of the patient. Sedation performed by different physician. The patient was monitored while under  deep sedation. The patient's vital signs; including heart rate, blood pressure, and oxygen saturation; remained stable throughout the procedure. The patient developed no complications during the procedure.  IMPRESSIONS   1. Post trans septal puncture only left to right shunting. 2. Whale Tail appendage with prominent posterior lobe. Well placed 24 mm FLX device with negative tugg test. No leak by color flow Average compression 11%. 3. Left ventricular ejection fraction, by estimation, is 60 to 65%. The left ventricle has normal function.  IVC RV Basal diam:  3.60 cm     IVC diam: 0.90 cm RV S prime:     11.00 cm/s TAPSE (M-mode): 2.2 cm  LEFT ATRIUM             Index        RIGHT ATRIUM            Index LA diam:        4.60 cm 2.66 cm/m   RA Area:     11.40 cm LA Vol (A2C):   41.7 ml 24.10 ml/m  RA Volume:   26.50 ml  15.31 ml/m LA Vol (A4C):   72.6 ml 41.96 ml/m LA Biplane Vol: 58.3 ml 33.69 ml/m AORTIC VALVE LVOT Vmax:   98.50 cm/s LVOT Vmean:  63.800 cm/s LVOT VTI:    0.214 m  AORTA Ao Root diam: 2.90 cm Ao Asc diam:  3.20 cm  MITRAL VALVE                TRICUSPID VALVE MV Area (PHT): 4.23 cm     TR Peak grad:   17.0 mmHg MV Decel Time: 180 msec     TR Vmax:        206.00 cm/s MV E velocity: 111.00 cm/s MV A velocity: 47.15 cm/s   SHUNTS MV E/A ratio:  2.35         Systemic VTI:  0.21 m Systemic Diam: 1.80 cm  Chilton Si MD Electronically signed by Chilton Si MD Signature Date/Time: 12/06/2022/2:54:32 PM    Final   TEE  ECHO TEE 03/07/2023  Narrative TRANSESOPHOGEAL ECHO REPORT    Patient Name:   Mary Riley Date of Exam: 03/07/2023 Medical Rec #:  [id number]    Height:       63.0 in Accession #:    [id number]   Weight:       159.2 lb Date of Birth:  May 18, 1946    BSA:          1.755 m Patient Age:    77 years     BP:           144/73 mmHg Patient Gender: F            HR:           99 bpm. Exam Location:  Inpatient  Procedure: Transesophageal Echo, 3D Echo, Color Doppler and Cardiac Doppler  Indications:     A-Fib  History:         Patient has prior history of Echocardiogram examinations, most recent 12/06/2022. Arrythmias:Atrial Fibrillation; Risk Factors:Hypertension and Dyslipidemia.  Sonographer:     Irving Burton Senior RDCS Referring Phys:  [id number] Lanier Prude Diagnosing Phys: Charlton Haws MD   Sonographer Comments: Watchman Procedure   PROCEDURE: After discussion of the risks and benefits of a TEE, an informed consent was obtained from the patient. The transesophogeal probe was passed without difficulty through the esophogus of the patient. Sedation performed by different physician. The patient was monitored while under  deep sedation. The patient's vital signs; including heart rate, blood pressure, and oxygen saturation; remained stable throughout the procedure. The patient developed no complications during the procedure.  IMPRESSIONS   1. Post trans septal puncture only left to right shunting. 2. Whale Tail appendage with prominent posterior lobe. Well placed 24 mm FLX device with negative tugg test. No leak by color flow Average compression 11%. 3. Left ventricular ejection fraction, by estimation, is 60 to 65%. The left ventricle has normal function.  IVC RV Basal diam:  3.60 cm     IVC diam: 0.90 cm RV S prime:     11.00 cm/s TAPSE (M-mode): 2.2 cm  LEFT ATRIUM             Index        RIGHT ATRIUM            Index LA diam:        4.60 cm 2.66 cm/m   RA Area:     11.40 cm LA Vol (A2C):   41.7 ml 24.10 ml/m  RA Volume:   26.50 ml  15.31 ml/m LA Vol (A4C):   72.6 ml 41.96 ml/m LA Biplane Vol: 58.3 ml 33.69 ml/m AORTIC VALVE LVOT Vmax:   98.50 cm/s LVOT Vmean:  63.800 cm/s LVOT VTI:    0.214 m  AORTA Ao Root diam: 2.90 cm Ao Asc diam:  3.20 cm  MITRAL VALVE                TRICUSPID VALVE MV Area (PHT): 4.23 cm     TR Peak grad:   17.0 mmHg MV Decel Time: 180 msec     TR Vmax:        206.00 cm/s MV E velocity: 111.00 cm/s MV A velocity: 47.15 cm/s   SHUNTS MV E/A ratio:  2.35         Systemic VTI:  0.21 m Systemic Diam: 1.80 cm  Chilton Si MD Electronically signed by Chilton Si MD Signature Date/Time: 12/06/2022/2:54:32 PM    Final   TEE  ECHO TEE 03/07/2023  Narrative TRANSESOPHOGEAL ECHO REPORT    Patient Name:   Mary Riley Date of Exam: 03/07/2023 Medical Rec #:  [id number]    Height:       63.0 in Accession #:    [id number]   Weight:       159.2 lb Date of Birth:  May 18, 1946    BSA:          1.755 m Patient Age:    77 years     BP:           144/73 mmHg Patient Gender: F            HR:           99 bpm. Exam Location:  Inpatient  Procedure: Transesophageal Echo, 3D Echo, Color Doppler and Cardiac Doppler  Indications:     A-Fib  History:         Patient has prior history of Echocardiogram examinations, most recent 12/06/2022. Arrythmias:Atrial Fibrillation; Risk Factors:Hypertension and Dyslipidemia.  Sonographer:     Irving Burton Senior RDCS Referring Phys:  [id number] Lanier Prude Diagnosing Phys: Charlton Haws MD   Sonographer Comments: Watchman Procedure   PROCEDURE: After discussion of the risks and benefits of a TEE, an informed consent was obtained from the patient. The transesophogeal probe was passed without difficulty through the esophogus of the patient. Sedation performed by different physician. The patient was monitored while under  deep sedation. The patient's vital signs; including heart rate, blood pressure, and oxygen saturation; remained stable throughout the procedure. The patient developed no complications during the procedure.  IMPRESSIONS   1. Post trans septal puncture only left to right shunting. 2. Whale Tail appendage with prominent posterior lobe. Well placed 24 mm FLX device with negative tugg test. No leak by color flow Average compression 11%. 3. Left ventricular ejection fraction, by estimation, is 60 to 65%. The left ventricle has normal function.  cetirizine (ZYRTEC) 10 MG tablet Take 10 mg by mouth daily.   Evolocumab (REPATHA SURECLICK) 140 MG/ML SOAJ Inject 140 mg into the skin every 14 (fourteen) days.   ezetimibe (ZETIA) 10 MG tablet Take 10 mg by mouth daily.   fluticasone (FLONASE) 50 MCG/ACT nasal spray Place 1 spray into both nostrils daily.   montelukast (SINGULAIR) 10 MG tablet Take 10 mg by mouth daily.   pantoprazole (PROTONIX) 40 MG tablet Take 40 mg by mouth daily.   Potassium 99 MG TABS Take 1 tablet by mouth daily.   Probiotic Product (PROBIOTIC PO) Take 1 capsule by mouth daily.   [DISCONTINUED] amLODipine (NORVASC) 5 MG tablet Take 1 tablet (5 mg total) by mouth daily.   [DISCONTINUED] clopidogrel (PLAVIX) 75 MG tablet Take 1 tablet (75 mg total) by mouth daily. Start on 04/22/23   [DISCONTINUED] metoprolol tartrate (LOPRESSOR)  50 MG tablet TAKE 1 TABLET(50 MG) BY MOUTH TWICE DAILY   [DISCONTINUED] predniSONE (DELTASONE) 50 MG tablet Take one 50mg  tablet by mouth on 5/22 at 1030pm; take second tablet on 5/23 at 430am, and take final tablet on 5/23 at 1030am.     Allergies:   Dilaudid [hydromorphone hcl], Ace inhibitors, Codeine, Crestor [rosuvastatin], Demerol [meperidine], Lipitor [atorvastatin], Iodinated contrast media, Biaxin [clarithromycin], Other, Sulfa antibiotics, and Zithromax [azithromycin]   Social History   Socioeconomic History   Marital status: Widowed    Spouse name: Not on file   Number of children: Not on file   Years of education: Not on file   Highest education level: Not on file  Occupational History   Not on file  Tobacco Use   Smoking status: Never   Smokeless tobacco: Never  Substance and Sexual Activity   Alcohol use: Yes    Alcohol/week: 1.0 standard drink of alcohol    Types: 1 Glasses of wine per week   Drug use: No   Sexual activity: Not on file  Other Topics Concern   Not on file  Social History Narrative   Not on file   Social Determinants of Health   Financial Resource Strain: Not on file  Food Insecurity: Not on file  Transportation Needs: Not on file  Physical Activity: Not on file  Stress: Not on file  Social Connections: Not on file     Family History: The patient's family history includes Aneurysm in her mother and sister; Cancer in her brother and maternal grandmother; Heart disease in her sister; Heart disease (age of onset: 59) in her mother; Hypertension in her mother.  ROS:   Please see the history of present illness.    All other systems reviewed and are negative.  EKGs/Labs/Other Studies Reviewed:    The following studies were reviewed today:   Cardiac Studies & Procedures       ECHOCARDIOGRAM  ECHOCARDIOGRAM COMPLETE 12/06/2022  Narrative ECHOCARDIOGRAM REPORT    Patient Name:   Mary Riley  Date of Exam: 12/06/2022 Medical Rec #:   [id number]     Height:       62.5 in Accession #:    [id number]    Weight:       156.0 lb Date of Birth:  Apr 30, 1946     BSA:          1.730 m Patient Age:    77 years      BP:           128/82 mmHg Patient Gender: F  cetirizine (ZYRTEC) 10 MG tablet Take 10 mg by mouth daily.   Evolocumab (REPATHA SURECLICK) 140 MG/ML SOAJ Inject 140 mg into the skin every 14 (fourteen) days.   ezetimibe (ZETIA) 10 MG tablet Take 10 mg by mouth daily.   fluticasone (FLONASE) 50 MCG/ACT nasal spray Place 1 spray into both nostrils daily.   montelukast (SINGULAIR) 10 MG tablet Take 10 mg by mouth daily.   pantoprazole (PROTONIX) 40 MG tablet Take 40 mg by mouth daily.   Potassium 99 MG TABS Take 1 tablet by mouth daily.   Probiotic Product (PROBIOTIC PO) Take 1 capsule by mouth daily.   [DISCONTINUED] amLODipine (NORVASC) 5 MG tablet Take 1 tablet (5 mg total) by mouth daily.   [DISCONTINUED] clopidogrel (PLAVIX) 75 MG tablet Take 1 tablet (75 mg total) by mouth daily. Start on 04/22/23   [DISCONTINUED] metoprolol tartrate (LOPRESSOR)  50 MG tablet TAKE 1 TABLET(50 MG) BY MOUTH TWICE DAILY   [DISCONTINUED] predniSONE (DELTASONE) 50 MG tablet Take one 50mg  tablet by mouth on 5/22 at 1030pm; take second tablet on 5/23 at 430am, and take final tablet on 5/23 at 1030am.     Allergies:   Dilaudid [hydromorphone hcl], Ace inhibitors, Codeine, Crestor [rosuvastatin], Demerol [meperidine], Lipitor [atorvastatin], Iodinated contrast media, Biaxin [clarithromycin], Other, Sulfa antibiotics, and Zithromax [azithromycin]   Social History   Socioeconomic History   Marital status: Widowed    Spouse name: Not on file   Number of children: Not on file   Years of education: Not on file   Highest education level: Not on file  Occupational History   Not on file  Tobacco Use   Smoking status: Never   Smokeless tobacco: Never  Substance and Sexual Activity   Alcohol use: Yes    Alcohol/week: 1.0 standard drink of alcohol    Types: 1 Glasses of wine per week   Drug use: No   Sexual activity: Not on file  Other Topics Concern   Not on file  Social History Narrative   Not on file   Social Determinants of Health   Financial Resource Strain: Not on file  Food Insecurity: Not on file  Transportation Needs: Not on file  Physical Activity: Not on file  Stress: Not on file  Social Connections: Not on file     Family History: The patient's family history includes Aneurysm in her mother and sister; Cancer in her brother and maternal grandmother; Heart disease in her sister; Heart disease (age of onset: 59) in her mother; Hypertension in her mother.  ROS:   Please see the history of present illness.    All other systems reviewed and are negative.  EKGs/Labs/Other Studies Reviewed:    The following studies were reviewed today:   Cardiac Studies & Procedures       ECHOCARDIOGRAM  ECHOCARDIOGRAM COMPLETE 12/06/2022  Narrative ECHOCARDIOGRAM REPORT    Patient Name:   Mary Riley  Date of Exam: 12/06/2022 Medical Rec #:   [id number]     Height:       62.5 in Accession #:    [id number]    Weight:       156.0 lb Date of Birth:  Apr 30, 1946     BSA:          1.730 m Patient Age:    77 years      BP:           128/82 mmHg Patient Gender: F  [facility]                                     Cardiology Office Note:    Date:  09/09/2023   ID:  Betzaida Ahlstrand, DOB 01/30/1946, MRN [id number]  PCP:  Laurann Montana, MD  [facility] Cardiologist:  Charlton Haws, MD  [facility] Electrophysiologist:  Lanier Prude, MD   Referring MD: Laurann Montana, MD   Chief Complaint  Patient presents with   Follow-up    6 month s/p LAAO    History of Present Illness:    Audray Matsunaga is a 77 y.o. female with a hx of paroxysmal atrial fibrillation, CKD stage III, GERD, HTN, and hiatal hernia.    She was referred to Dr. Lalla Brothers for the evaluation of LAAO closure with Watchman. In consultation she was noted to be quite tachycardic therefore labs were performed that were reassuring. She was seen back several weeks later with HR returning to normal range. Given this, plan was to proceed with Watchman workup. CT imaging showed anatomy suitable for implant and was scheduled for 03/07/23.    She is now s/p successful LAAO closure with Watchman FLX 24mm device. She was restarted on Xarelto with plans to continue for 45 days after implant (04/21/23). After 45 days, stop Xarelto and start Plavix 75mg  PO daily to complete 6 months of post implant therapy. Post CT with stable device seal with no leak or thrombus.   Today she is here alone and reports that she has been well since she was last seen. Denies issues with bleeding, chest pain, SOB, palpitations, LE edema, orthopnea, dizziness, or syncope.    Past Medical History:  Diagnosis Date   Abdominal pain 10/2013   Allergy    Aneurysm of splenic artery (HCC) 10/2013   Ref by Dr. Willis Modena   Benign hematuria    Chronic kidney disease    stage 3   Dyslipidemia    GERD (gastroesophageal reflux disease)    History of Crohn's disease    Hypertension    Osteoporosis    Paroxysmal atrial fibrillation [facility])     Past Surgical History:  Procedure Laterality Date   ABDOMINAL  HYSTERECTOMY     BILATERAL OOPHORECTOMY     d/t cyst   BREAST BIOPSY     benign   CATARACT EXTRACTION, BILATERAL     COLONOSCOPY  2006 and 2011   Dr. Sherin Quarry   FOOT SURGERY Left 2012   KNEE SURGERY Right Aug. 2006   LEFT ATRIAL APPENDAGE OCCLUSION N/A 03/07/2023   Procedure: LEFT ATRIAL APPENDAGE OCCLUSION;  Surgeon: Lanier Prude, MD;  Location: [facility] INVASIVE CV LAB;  Service: Cardiovascular;  Laterality: N/A;   TEE WITHOUT CARDIOVERSION N/A 03/07/2023   Procedure: TRANSESOPHAGEAL ECHOCARDIOGRAM (TEE);  Surgeon: Lanier Prude, MD;  Location: [facility] INVASIVE CV LAB;  Service: Cardiovascular;  Laterality: N/A;   TUMOR EXCISION     from kidney    Current Medications: Current Meds  Medication Sig   acetaminophen (TYLENOL) 500 MG tablet Take 500 mg by mouth every 6 (six) hours as needed.   albuterol (VENTOLIN HFA) 108 (90 Base) MCG/ACT inhaler Inhale 1 puff into the lungs every 6 (six) hours as needed for wheezing or shortness of breath.   aspirin-acetaminophen-caffeine (EXCEDRIN EXTRA STRENGTH) 250-250-65 MG tablet Take 2 tablets by mouth every 6 (six) hours as needed for headache.

## 2023-09-09 ENCOUNTER — Ambulatory Visit: Payer: Medicare Other | Attending: Cardiology | Admitting: Cardiology

## 2023-09-09 VITALS — BP 124/80 | HR 56 | Ht 63.75 in | Wt 161.0 lb

## 2023-09-09 DIAGNOSIS — Z95818 Presence of other cardiac implants and grafts: Secondary | ICD-10-CM

## 2023-09-09 DIAGNOSIS — I480 Paroxysmal atrial fibrillation: Secondary | ICD-10-CM

## 2023-09-09 DIAGNOSIS — N182 Chronic kidney disease, stage 2 (mild): Secondary | ICD-10-CM

## 2023-09-09 DIAGNOSIS — I10 Essential (primary) hypertension: Secondary | ICD-10-CM

## 2023-09-09 MED ORDER — AMLODIPINE BESYLATE 5 MG PO TABS: 5.0000 mg | ORAL_TABLET | Freq: Every day | ORAL | 3 refills | Status: AC

## 2023-09-09 MED ORDER — METOPROLOL TARTRATE 50 MG PO TABS: 50.0000 mg | ORAL_TABLET | Freq: Two times a day (BID) | ORAL | 3 refills | Status: AC

## 2023-09-09 NOTE — Patient Instructions (Signed)
Medication Instructions:  Your physician recommends that you continue on your current medications as directed. Please refer to the Current Medication list given to you today.   *If you need a refill on your cardiac medications before your next appointment, please call your pharmacy*   Lab Work: None ordered   If you have labs (blood work) drawn today and your tests are completely normal, you will receive your results only by: MyChart Message (if you have MyChart) OR A paper copy in the mail If you have any lab test that is abnormal or we need to change your treatment, we will call you to review the results.   Testing/Procedures: None ordered    Follow-Up: Follow up as planned   Other Instructions

## 2023-09-19 ENCOUNTER — Ambulatory Visit (INDEPENDENT_AMBULATORY_CARE_PROVIDER_SITE_OTHER): Payer: Medicare Other | Admitting: Otolaryngology

## 2023-09-19 ENCOUNTER — Encounter (INDEPENDENT_AMBULATORY_CARE_PROVIDER_SITE_OTHER): Payer: Self-pay | Admitting: Otolaryngology

## 2023-09-19 ENCOUNTER — Ambulatory Visit (INDEPENDENT_AMBULATORY_CARE_PROVIDER_SITE_OTHER): Payer: Medicare Other | Admitting: Audiology

## 2023-09-19 DIAGNOSIS — H903 Sensorineural hearing loss, bilateral: Secondary | ICD-10-CM

## 2023-09-19 DIAGNOSIS — H6121 Impacted cerumen, right ear: Secondary | ICD-10-CM | POA: Diagnosis not present

## 2023-09-19 DIAGNOSIS — H6983 Other specified disorders of Eustachian tube, bilateral: Secondary | ICD-10-CM | POA: Diagnosis not present

## 2023-09-19 DIAGNOSIS — J310 Chronic rhinitis: Secondary | ICD-10-CM | POA: Insufficient documentation

## 2023-09-19 NOTE — Progress Notes (Unsigned)
[facility] [address] [place], [place] [address]   Audiological Evaluation    History: Mary Riley was referred today for a hearing evaluation by Dr. Janeece Riggers Philomena Doheny. She was previously seen at [facility] for an audiological evaluation on October 12th, 2022.  Tympanogram: Right ear: Could not obtain seal, unable to determine middle ear status at this time. Left ear: Normal external ear canal volume with normal middle ear pressure and tympanic membrane compliance (Type A).   Hearing Evaluation: The audiogram was completed using conventional audiometric techniques under headphones with good reliability.   The hearing test results indicate: Right ear: Normal hearing sensitivity from [phone] sloping to moderately-severe sensorineural hearing loss from 3000-8000 Hz. Left ear: Normal hearing sensitivity from [phone] sloping to moderate sensorineural hearing loss from 3000-8000 Hz.   Speech Recognition Thresholds were obtained at 20 dBHL in the right ear and 20 dBHL in the left ear.   Word Recognition Testing was completed at 60 dBHL in the right ear and at 60 dBHL in the left ear and the patient scored 100% in the right ear and 100% in the left ear.   Recommendations: Repeat audiogram when changes are perceived or per MD.   Mary Riley Mary Riley, AUD, CCC-A 09/19/2023

## 2023-09-19 NOTE — Progress Notes (Signed)
Patient ID: Mary Riley, female   DOB: [birth year], 77 y.o.   MRN: [id number]  Follow-up: Right ear infection, right tympanic membrane perforation, chronic eustachian tube dysfunction, chronic nasal congestion  HPI: The patient is a 77 year old female who returns today for her follow-up evaluation.  The patient has a history of chronic eustachian tube dysfunction and middle ear effusion.  She underwent right myringotomy and tube placement in 2022.  The tube has since extruded.  The patient was last seen 6 weeks ago.  At that time, she was noted to have an acute right otitis media with purulent otorrhea.  A right TM perforation was also noted.  The patient was treated with Ciprodex eardrops.  According to the patient, the right ear drainage has resolved.  However, she continues to have clogging and popping sensation in her ears, worse on the right side.  She also has frequent shooting right ear pain.  She has a history of chronic nasal congestion.  She is currently on Flonase, Singulair, and Zyrtec.  Exam: General: Communicates without difficulty, well nourished, no acute distress. Head: Normocephalic, no evidence injury, no tenderness, facial buttresses intact without stepoff. Eyes: PERRL, EOMI. No scleral icterus, conjunctivae clear. Neuro: CN II exam reveals vision grossly intact. No nystagmus at any point of gaze. Ears: Auricles well formed without lesions.  Right ear cerumen accumulation.  Under the operating microscope, the right ear cerumen is carefully removed.  The right tympanic membrane is intact and mobile.  The left ear is also normal.  Nose: External evaluation reveals normal support and skin without lesions. Dorsum is intact. Anterior rhinoscopy reveals congested mucosa over anterior aspect of inferior turbinates and intact septum. No purulence noted. Oral:  Oral cavity and oropharynx are intact, symmetric, without erythema or edema. Mucosa is moist without lesions. Neck: Full range of motion  without pain. There is no significant lymphadenopathy. No masses palpable. Thyroid bed within normal limits to palpation. Parotid glands and submandibular glands equal bilaterally without mass. Trachea is midline. Neuro:  CN 2-12 grossly intact. Gait normal. Vestibular: No nystagmus at any point of gaze. The cerebellar examination is unremarkable.   Procedure: Right ear cerumen disimpaction Anesthesia: None Description: Under the operating microscope, the cerumen is carefully removed with a combination of cerumen currette, alligator forceps, and suction catheters.  After the cerumen is removed, the TMs are noted to be normal.  No mass, erythema, or lesions. The patient tolerated the procedure well.   Assessment: 1.  Right ear cerumen accumulation.  After the cerumen removal procedure, the right tympanic membrane is noted to be intact and mobile.  The previously noted perforation has healed.  No middle ear effusion is noted today. 2.  The patient's history is consistent with eustachian tube dysfunction, worse on the right side. 3.  Bilateral high-frequency sensorineural hearing loss, likely secondary to routine presbycusis.  Plan: 1.  Otomicroscopy with right ear cerumen removal. 2.  The physical exam findings are reviewed with the patient. 3.  The patient is reassured that her infection has resolved. 4.  Continue with her treatment allergy regimen of Flonase, Singulair, and Zyrtec. 5.  In light of her persistent symptoms, she may benefit from an allergy referral and evaluation.

## 2023-09-20 ENCOUNTER — Telehealth (INDEPENDENT_AMBULATORY_CARE_PROVIDER_SITE_OTHER): Payer: Self-pay | Admitting: Otolaryngology

## 2023-09-26 DIAGNOSIS — Z79899 Other long term (current) drug therapy: Secondary | ICD-10-CM | POA: Diagnosis not present

## 2023-09-26 DIAGNOSIS — Z0000 Encounter for general adult medical examination without abnormal findings: Secondary | ICD-10-CM | POA: Diagnosis not present

## 2023-09-26 DIAGNOSIS — D6869 Other thrombophilia: Secondary | ICD-10-CM | POA: Diagnosis not present

## 2023-09-26 DIAGNOSIS — N183 Chronic kidney disease, stage 3 unspecified: Secondary | ICD-10-CM | POA: Diagnosis not present

## 2023-09-26 DIAGNOSIS — E785 Hyperlipidemia, unspecified: Secondary | ICD-10-CM | POA: Diagnosis not present

## 2023-09-26 DIAGNOSIS — J4530 Mild persistent asthma, uncomplicated: Secondary | ICD-10-CM | POA: Diagnosis not present

## 2023-09-26 DIAGNOSIS — K219 Gastro-esophageal reflux disease without esophagitis: Secondary | ICD-10-CM | POA: Diagnosis not present

## 2023-09-26 DIAGNOSIS — M810 Age-related osteoporosis without current pathological fracture: Secondary | ICD-10-CM | POA: Diagnosis not present

## 2023-09-26 DIAGNOSIS — I728 Aneurysm of other specified arteries: Secondary | ICD-10-CM | POA: Diagnosis not present

## 2023-09-26 DIAGNOSIS — G720 Drug-induced myopathy: Secondary | ICD-10-CM | POA: Diagnosis not present

## 2023-09-26 DIAGNOSIS — I129 Hypertensive chronic kidney disease with stage 1 through stage 4 chronic kidney disease, or unspecified chronic kidney disease: Secondary | ICD-10-CM | POA: Diagnosis not present

## 2023-09-26 DIAGNOSIS — I671 Cerebral aneurysm, nonruptured: Secondary | ICD-10-CM | POA: Diagnosis not present

## 2023-09-26 DIAGNOSIS — I480 Paroxysmal atrial fibrillation: Secondary | ICD-10-CM | POA: Diagnosis not present

## 2023-10-21 ENCOUNTER — Other Ambulatory Visit: Payer: Self-pay | Admitting: Allergy

## 2023-10-21 ENCOUNTER — Ambulatory Visit
Admission: RE | Admit: 2023-10-21 | Discharge: 2023-10-21 | Disposition: A | Discharge status: Home / Self Care | Payer: Medicare Other | Source: Ambulatory Visit | Attending: Allergy | Admitting: Allergy

## 2023-10-21 DIAGNOSIS — R052 Subacute cough: Secondary | ICD-10-CM

## 2023-10-21 DIAGNOSIS — J3089 Other allergic rhinitis: Secondary | ICD-10-CM | POA: Diagnosis not present

## 2023-10-30 ENCOUNTER — Ambulatory Visit (INDEPENDENT_AMBULATORY_CARE_PROVIDER_SITE_OTHER): Payer: Medicare Other | Admitting: Otolaryngology

## 2023-10-30 ENCOUNTER — Encounter (INDEPENDENT_AMBULATORY_CARE_PROVIDER_SITE_OTHER): Payer: Self-pay

## 2023-10-30 VITALS — Ht 63.0 in | Wt 157.0 lb

## 2023-10-30 DIAGNOSIS — J310 Chronic rhinitis: Secondary | ICD-10-CM | POA: Diagnosis not present

## 2023-10-30 DIAGNOSIS — J343 Hypertrophy of nasal turbinates: Secondary | ICD-10-CM | POA: Diagnosis not present

## 2023-10-30 DIAGNOSIS — J342 Deviated nasal septum: Secondary | ICD-10-CM

## 2023-10-30 DIAGNOSIS — H903 Sensorineural hearing loss, bilateral: Secondary | ICD-10-CM

## 2023-10-30 DIAGNOSIS — H6983 Other specified disorders of Eustachian tube, bilateral: Secondary | ICD-10-CM

## 2023-10-30 DIAGNOSIS — J340 Abscess, furuncle and carbuncle of nose: Secondary | ICD-10-CM

## 2023-11-03 DIAGNOSIS — J343 Hypertrophy of nasal turbinates: Secondary | ICD-10-CM | POA: Insufficient documentation

## 2023-11-03 DIAGNOSIS — J342 Deviated nasal septum: Secondary | ICD-10-CM | POA: Insufficient documentation

## 2023-11-03 DIAGNOSIS — H903 Sensorineural hearing loss, bilateral: Secondary | ICD-10-CM | POA: Insufficient documentation

## 2023-11-03 DIAGNOSIS — J340 Abscess, furuncle and carbuncle of nose: Secondary | ICD-10-CM | POA: Insufficient documentation

## 2023-11-03 NOTE — Progress Notes (Signed)
Patient ID: Mary Riley, female   DOB: 1946/10/27, 77 y.o.   MRN: [id number]  Follow-up: Eustachian tube dysfunction, hearing loss, right middle ear effusion, chronic nasal congestion, possible sinusitis  HPI: The patient is a 77 year old female who returns today for follow-up evaluation.  The patient has a history of chronic nasal congestion, chronic eustachian tube dysfunction, and recurrent middle ear effusion.  She was previously treated with right tube placement in 2022.  The tube has since extruded, resulting in recurrence of her middle ear effusion.  At her last visit 1 month ago, the right tympanic membrane was intact without any significant effusion.  She also has bilateral high-frequency sensorineural hearing loss and chronic nasal congestion.  She recently saw Dr. Sidney Ace, her allergist, and she was started on azelastine, Xyzal, and Flonase nasal spray.  The patient returns today complaining of intermittent facial pain and a nasal sore at the entrance to her left nostril.  She would like to be evaluated for possible sinusitis.  She denies any recent change in her hearing.  In addition, the patient also complains of chronic cough and throat irritation.  Exam: General: Communicates without difficulty, well nourished, no acute distress. Head: Normocephalic, no evidence injury, no tenderness, facial buttresses intact without stepoff. Eyes: PERRL, EOMI. No scleral icterus, conjunctivae clear. Neuro: CN II exam reveals vision grossly intact. No nystagmus at any point of gaze. Ears: Auricles well formed without lesions.  Both ear canals and tympanic membranes are noted to be normal.  No TM perforation or middle ear effusion is noted today.  Nose: External evaluation reveals normal support and skin without lesions. Dorsum is intact. Anterior rhinoscopy reveals congested mucosa over anterior aspect of inferior turbinates and intact septum. No purulence noted. Oral:  Oral cavity and oropharynx are  intact, symmetric, without erythema or edema. Mucosa is moist without lesions. Neck: Full range of motion without pain. There is no significant lymphadenopathy. No masses palpable. Thyroid bed within normal limits to palpation. Parotid glands and submandibular glands equal bilaterally without mass. Trachea is midline. Neuro:  CN 2-12 grossly intact. Gait normal. Vestibular: No nystagmus at any point of gaze. The cerebellar examination is unremarkable.   Procedure:  Flexible Nasal Endoscopy: Description: Risks, benefits, and alternatives of flexible endoscopy were explained to the patient.  Specific mention was made of the risk of throat numbness with difficulty swallowing, possible bleeding from the nose and mouth, and pain from the procedure.  The patient gave oral consent to proceed.  The flexible scope was inserted into the right nasal cavity.  Endoscopy of the interior nasal cavity, superior, inferior, and middle meatus was performed. The sphenoid-ethmoid recess was examined. Edematous mucosa was noted.  No polyp, mass, or lesion was appreciated. Nasal septal deviation noted. Olfactory cleft was clear.  Nasopharynx was clear.  Turbinates were hypertrophied but without mass.  The procedure was repeated on the contralateral side with similar findings.  The patient tolerated the procedure well.    Assessment: 1.  Chronic/allergic rhinitis with nasal mucosal congestion, nasal septal deviation, and bilateral inferior turbinate hypertrophy.  No purulent drainage, polyps, mass, or lesion is noted today. 2.  Her history is consistent with left nasal impetigo. 3.  Her ear canals, tympanic membranes, and middle ear spaces are normal.  No middle ear effusion is noted today. 4.  Bilateral eustachian tube dysfunction and subjectively stable bilateral high-frequency sensorineural hearing loss.  Plan: 1.  The physical exam and nasal endoscopy findings are reviewed with the patient.  The patient is reassured that no  acute infection is noted today. 2.  Continue with her current allergy treatment regimen of azelastine, Xyzal, and Flonase. 3.  Bactroban ointment to treat her recurrent nasal impetigo. 4.  Valsalva exercise multiple times a day. 5.  The patient also complains of chronic cough and chronic throat irritation.  A referral to see laryngologist Dr. Ashok Croon will be arranged.

## 2023-11-21 DIAGNOSIS — Z961 Presence of intraocular lens: Secondary | ICD-10-CM | POA: Diagnosis not present

## 2023-11-21 DIAGNOSIS — H04123 Dry eye syndrome of bilateral lacrimal glands: Secondary | ICD-10-CM | POA: Diagnosis not present

## 2023-11-21 DIAGNOSIS — H5319 Other subjective visual disturbances: Secondary | ICD-10-CM | POA: Diagnosis not present

## 2023-11-21 DIAGNOSIS — H524 Presbyopia: Secondary | ICD-10-CM | POA: Diagnosis not present

## 2023-11-21 DIAGNOSIS — H43813 Vitreous degeneration, bilateral: Secondary | ICD-10-CM | POA: Diagnosis not present

## 2023-11-26 DIAGNOSIS — M7062 Trochanteric bursitis, left hip: Secondary | ICD-10-CM | POA: Diagnosis not present

## 2023-12-04 ENCOUNTER — Encounter (INDEPENDENT_AMBULATORY_CARE_PROVIDER_SITE_OTHER): Payer: Self-pay | Admitting: Otolaryngology

## 2023-12-04 ENCOUNTER — Ambulatory Visit (INDEPENDENT_AMBULATORY_CARE_PROVIDER_SITE_OTHER): Payer: Medicare Other | Admitting: Otolaryngology

## 2023-12-04 VITALS — BP 162/79 | HR 103

## 2023-12-04 DIAGNOSIS — R053 Chronic cough: Secondary | ICD-10-CM | POA: Diagnosis not present

## 2023-12-04 DIAGNOSIS — J343 Hypertrophy of nasal turbinates: Secondary | ICD-10-CM | POA: Diagnosis not present

## 2023-12-04 DIAGNOSIS — K219 Gastro-esophageal reflux disease without esophagitis: Secondary | ICD-10-CM

## 2023-12-04 DIAGNOSIS — R490 Dysphonia: Secondary | ICD-10-CM | POA: Diagnosis not present

## 2023-12-04 DIAGNOSIS — J383 Other diseases of vocal cords: Secondary | ICD-10-CM

## 2023-12-04 DIAGNOSIS — J3089 Other allergic rhinitis: Secondary | ICD-10-CM

## 2023-12-04 DIAGNOSIS — J342 Deviated nasal septum: Secondary | ICD-10-CM | POA: Diagnosis not present

## 2023-12-04 DIAGNOSIS — J45909 Unspecified asthma, uncomplicated: Secondary | ICD-10-CM | POA: Diagnosis not present

## 2023-12-04 DIAGNOSIS — J387 Other diseases of larynx: Secondary | ICD-10-CM | POA: Diagnosis not present

## 2023-12-04 DIAGNOSIS — R0981 Nasal congestion: Secondary | ICD-10-CM | POA: Diagnosis not present

## 2023-12-04 DIAGNOSIS — R0982 Postnasal drip: Secondary | ICD-10-CM

## 2023-12-04 NOTE — Patient Instructions (Addendum)
[email or web address] - check out this website to learn more about reflux   -Avoid lying down for at least two hours after a meal or after drinking acidic beverages, like soda, or other caffeinated beverages. This can help to prevent stomach contents from flowing back into the esophagus. -Keep your head elevated while you sleep. Using an extra pillow or two can also help to prevent reflux. -Eat smaller and more frequent meals each day instead of a few large meals. This promotes digestion and can aid in preventing heartburn. -Wear loose-fitting clothes to ease pressure on the stomach, which can worsen heartburn and reflux. -Reduce excess weight around the midsection. This can ease pressure on the stomach. Such pressure can force some stomach contents back up the esophagus  - continue Xyzal and Singulair  - continue Azelastine and Flonase   - Take Reflux Gourmet (natural supplement available on Amazon) to help with symptoms of chronic throat irritation

## 2023-12-04 NOTE — Progress Notes (Unsigned)
ENT CONSULT:  Reason for Consult: chronic cough for 3 years    HPI: Mary Riley is an 77 y.o. female with hx   Records Reviewed:  Dr Avel Sensor office visit 10/30/23 Assessment: 1.  Chronic/allergic rhinitis with nasal mucosal congestion, nasal septal deviation, and bilateral inferior turbinate hypertrophy.  No purulent drainage, polyps, mass, or lesion is noted today. 2.  Her history is consistent with left nasal impetigo. 3.  Her ear canals, tympanic membranes, and middle ear spaces are normal.  No middle ear effusion is noted today. 4.  Bilateral eustachian tube dysfunction and subjectively stable bilateral high-frequency sensorineural hearing loss.   Plan: 1.  The physical exam and nasal endoscopy findings are reviewed with the patient.  The patient is reassured that no acute infection is noted today. 2.  Continue with her current allergy treatment regimen of azelastine, Xyzal, and Flonase. 3.  Bactroban ointment to treat her recurrent nasal impetigo. 4.  Valsalva exercise multiple times a day. 5.  The patient also complains of chronic cough and chronic throat irritation.  A referral to see laryngologist Dr. Ashok Croon will be arranged.    Past Medical History:  Diagnosis Date   Abdominal pain 10/2013   Allergy    Aneurysm of splenic artery (HCC) 10/2013   Ref by Dr. Willis Modena   Benign hematuria    Chronic kidney disease    stage 3   Dyslipidemia    GERD (gastroesophageal reflux disease)    History of Crohn's disease    Hypertension    Osteoporosis    Paroxysmal atrial fibrillation [facility])     Past Surgical History:  Procedure Laterality Date   ABDOMINAL HYSTERECTOMY     BILATERAL OOPHORECTOMY     d/t cyst   BREAST BIOPSY     benign   CATARACT EXTRACTION, BILATERAL     COLONOSCOPY  2006 and 2011   Dr. Sherin Quarry   FOOT SURGERY Left 2012   KNEE SURGERY Right Aug. 2006   LEFT ATRIAL APPENDAGE OCCLUSION N/A 03/07/2023   Procedure: LEFT ATRIAL APPENDAGE  OCCLUSION;  Surgeon: Lanier Prude, MD;  Location: [facility] INVASIVE CV LAB;  Service: Cardiovascular;  Laterality: N/A;   TEE WITHOUT CARDIOVERSION N/A 03/07/2023   Procedure: TRANSESOPHAGEAL ECHOCARDIOGRAM (TEE);  Surgeon: Lanier Prude, MD;  Location: [facility] INVASIVE CV LAB;  Service: Cardiovascular;  Laterality: N/A;   TUMOR EXCISION     from kidney    Family History  Problem Relation Age of Onset   Heart disease Mother 63       Heart Disease before age 30- Brain Aneurysm   Hypertension Mother    Aneurysm Mother        brain   Heart disease Sister        Brain Aneurysm   Aneurysm Sister        brain    Cancer Maternal Grandmother        Colon   Cancer Brother     Social History:  reports that she has never smoked. She has never used smokeless tobacco. She reports current alcohol use of about 1.0 standard drink of alcohol per week. She reports that she does not use drugs.  Allergies:  Allergies  Allergen Reactions   Dilaudid [Hydromorphone Hcl] Nausea And Vomiting   Ace Inhibitors Cough   Codeine Nausea Only   Crestor [Rosuvastatin] Other (See Comments)    Joints hurt   Demerol [Meperidine] Nausea Only   Lipitor [Atorvastatin] Other (See Comments)  Joints hurt   Iodinated Contrast Media Nausea And Vomiting    Abdominal pain   Sulfamethoxazole-Trimethoprim Other (See Comments)   Biaxin [Clarithromycin] Rash   Other Rash    EKG leads   Sulfa Antibiotics Rash   Zithromax [Azithromycin] Rash    Medications: I have reviewed the patient's current medications.  The PMH, PSH, Medications, Allergies, and SH were reviewed and updated.  ROS: Constitutional: Negative for fever, weight loss and weight gain. Cardiovascular: Negative for chest pain and dyspnea on exertion. Respiratory: Is not experiencing shortness of breath at rest. Gastrointestinal: Negative for nausea and vomiting. Neurological: Negative for headaches. Psychiatric: The patient is not  nervous/anxious  Blood pressure (!) 162/79, pulse (!) 103, SpO2 99%.  PHYSICAL EXAM:  Exam: General: Well-developed, well-nourished Communication and Voice: Clear pitch and clarity Respiratory Respiratory effort: Equal inspiration and expiration without stridor Cardiovascular Peripheral Vascular: Warm extremities with equal color/perfusion Eyes: No nystagmus with equal extraocular motion bilaterally Neuro/Psych/Balance: Patient oriented to person, place, and time; Appropriate mood and affect; Gait is intact with no imbalance; Cranial nerves I-XII are intact Head and Face Inspection: Normocephalic and atraumatic without mass or lesion Palpation: Facial skeleton intact without bony stepoffs Salivary Glands: No mass or tenderness Facial Strength: Facial motility symmetric and full bilaterally ENT Pinna: External ear intact and fully developed External canal: Canal is patent with intact skin Tympanic Membrane: Clear and mobile External Nose: No scar or anatomic deformity Internal Nose: Septum is ***. No polyp, or purulence. Mucosal edema and erythema present.  Bilateral inferior turbinate hypertrophy.  Lips, Teeth, and gums: Mucosa and teeth intact and viable TMJ: No pain to palpation with full mobility Oral cavity/oropharynx: No erythema or exudate, no lesions present Nasopharynx: No mass or lesion with intact mucosa Hypopharynx: Intact mucosa without pooling of secretions Larynx Glottic: Full true vocal cord mobility without lesion or mass Supraglottic: Normal appearing epiglottis and AE folds Interarytenoid Space: Moderate pachydermia&edema Subglottic Space: Patent without lesion or edema Neck Neck and Trachea: Midline trachea without mass or lesion Thyroid: No mass or nodularity Lymphatics: No lymphadenopathy  Procedure:      Studies Reviewed: CXR 2-View CLINICAL DATA:  Subacute cough   EXAM: CHEST - 2 VIEW   COMPARISON:  03/07/2023   FINDINGS: The heart size  and mediastinal contours are within normal limits. Both lungs are clear. The visualized skeletal structures are unremarkable. Moderate hiatal hernia   IMPRESSION: No active cardiopulmonary disease. Moderate hiatal hernia.  Assessment/Plan: No diagnosis found.  ***  Thank you for allowing me to participate in the care of this patient. Please do not hesitate to contact me with any questions or concerns.   Ashok Croon, MD Otolaryngology [facility] Phone: [phone] Fax: [phone]    12/04/2023, 2:25 PM
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
## 2024-01-03 ENCOUNTER — Ambulatory Visit: Payer: Medicare Other | Attending: Cardiovascular Disease | Admitting: Cardiovascular Disease

## 2024-01-03 VITALS — BP 134/70 | HR 71 | Ht 63.5 in | Wt 153.4 lb

## 2024-01-03 DIAGNOSIS — Z95818 Presence of other cardiac implants and grafts: Secondary | ICD-10-CM

## 2024-01-03 DIAGNOSIS — I480 Paroxysmal atrial fibrillation: Secondary | ICD-10-CM | POA: Diagnosis not present

## 2024-01-03 DIAGNOSIS — I10 Essential (primary) hypertension: Secondary | ICD-10-CM | POA: Diagnosis not present

## 2024-01-03 MED ORDER — REPATHA SURECLICK 140 MG/ML ~~LOC~~ SOAJ: 1.0000 | SUBCUTANEOUS | 3 refills | Status: AC

## 2024-01-03 NOTE — Patient Instructions (Addendum)
Medication Instructions:  Your physician recommends that you continue on your current medications as directed. Please refer to the Current Medication list given to you today.  *If you need a refill on your cardiac medications before your next appointment, please call your pharmacy*  Lab Work: If you have labs (blood work) drawn today and your tests are completely normal, you will receive your results only by: MyChart Message (if you have MyChart) OR A paper copy in the mail If you have any lab test that is abnormal or we need to change your treatment, we will call you to review the results.  Testing/Procedures: None ordered today.  Follow-Up: At [facility], you and your health needs are our priority.  As part of our continuing mission to provide you with exceptional heart care, we have created designated Provider Care Teams.  These Care Teams include your primary Cardiologist (physician) and Advanced Practice Providers (APPs -  Physician Assistants and Nurse Practitioners) who all work together to provide you with the care you need, when you need it.  We recommend signing up for the patient portal called "MyChart".  Sign up information is provided on this After Visit Summary.  MyChart is used to connect with patients for Virtual Visits (Telemedicine).  Patients are able to view lab/test results, encounter notes, upcoming appointments, etc.  Non-urgent messages can be sent to your provider as well.   To learn more about what you can do with MyChart, go to [email or web address].    Your next appointment:   1 year(s)  Provider:   Charlton Haws, MD     Other Instructions

## 2024-01-09 ENCOUNTER — Encounter (INDEPENDENT_AMBULATORY_CARE_PROVIDER_SITE_OTHER): Payer: Self-pay | Admitting: Otolaryngology

## 2024-01-09 ENCOUNTER — Ambulatory Visit (INDEPENDENT_AMBULATORY_CARE_PROVIDER_SITE_OTHER): Payer: Medicare Other | Admitting: Otolaryngology

## 2024-01-09 VITALS — BP 139/79 | HR 77

## 2024-01-09 DIAGNOSIS — K219 Gastro-esophageal reflux disease without esophagitis: Secondary | ICD-10-CM

## 2024-01-09 DIAGNOSIS — R0981 Nasal congestion: Secondary | ICD-10-CM | POA: Diagnosis not present

## 2024-01-09 DIAGNOSIS — R490 Dysphonia: Secondary | ICD-10-CM

## 2024-01-09 DIAGNOSIS — J342 Deviated nasal septum: Secondary | ICD-10-CM

## 2024-01-09 DIAGNOSIS — J343 Hypertrophy of nasal turbinates: Secondary | ICD-10-CM

## 2024-01-09 DIAGNOSIS — J3089 Other allergic rhinitis: Secondary | ICD-10-CM | POA: Diagnosis not present

## 2024-01-09 DIAGNOSIS — R053 Chronic cough: Secondary | ICD-10-CM

## 2024-01-09 DIAGNOSIS — J383 Other diseases of vocal cords: Secondary | ICD-10-CM

## 2024-01-09 DIAGNOSIS — J387 Other diseases of larynx: Secondary | ICD-10-CM

## 2024-01-09 NOTE — Progress Notes (Addendum)
ENT Progress Note:  Update 01/09/24: Discussed the use of AI scribe software for clinical note transcription with the patient, who gave verbal consent to proceed.  History of Present Illness   The patient, with a history of asthma and acid reflux, history of chronic cough for 3 yrs, presents for follow-up. Her cough had temporarily improved but has worsened again with the onset of cold weather. They report that the cough is severe enough to cause voice loss after prolonged periods of talking and is associated with some mucus production, likely from post-nasal drainage. The patient has been managing these symptoms with antihistamine, nasal sprays, and a medication for acid reflux, Reflux Gourmet which they report has been effective.  Despite these interventions, the cough persists.   Initial Evaluation 12/03/24 Reason for Consult: chronic cough for 3 years    HPI: Discussed the use of AI scribe software for clinical note transcription with the patient, who gave verbal consent to proceed.  History of Present Illness   The patient is a 78 yoF with a history of mild asthma and acid reflux, presents with a chronic cough and dysphonia fading  voice that has persisted for three years.   The cough is described as sometimes productive of mucus, and mostly dry, but the patient attributes this to postnasal drainage rather than the cough itself. The cough is particularly severe upon waking and when changing environments, such as moving from one room to another. The patient also reports a sensation of a "worm wiggling" down the throat, predominantly on the left side.  The patient has been on multiple medications including Xyzal, Flonase, azelastine, albuterol, and pantoprazole. Despite these treatments, the patient reports no significant improvement in symptoms. The patient has also undergone allergy testing, which was reportedly negative.  The patient's voice changes with fading and raspy voice have been  concurrent with the cough, with no associated pain when talking. The patient has a history of a hiatal hernia, which has been present since the 1980s. Despite taking pantoprazole for acid reflux, the patient still experiences heartburn symptoms depending on dietary intake.  The onset of the cough and voice changes was noted after a period of cleaning a dusty house x 6 weeks, which the patient speculates may have been the initial irritant. The patient denies any history of smoking and has no known exposure to risk factors for throat cancer.  The patient's asthma is reportedly mild and managed with albuterol, which provides relief. The patient has not been followed by a pulmonologist and the albuterol is prescribed by their primary care provider. The patient denies any shortness of breath outside of coughing fits and has no trouble swallowing.  Records Reviewed:  Dr Avel Sensor office visit 10/30/23 Assessment: 1.  Chronic/allergic rhinitis with nasal mucosal congestion, nasal septal deviation, and bilateral inferior turbinate hypertrophy.  No purulent drainage, polyps, mass, or lesion is noted today. 2.  Her history is consistent with left nasal impetigo. 3.  Her ear canals, tympanic membranes, and middle ear spaces are normal.  No middle ear effusion is noted today. 4.  Bilateral eustachian tube dysfunction and subjectively stable bilateral high-frequency sensorineural hearing loss.   Plan: 1.  The physical exam and nasal endoscopy findings are reviewed with the patient.  The patient is reassured that no acute infection is noted today. 2.  Continue with her current allergy treatment regimen of azelastine, Xyzal, and Flonase. 3.  Bactroban ointment to treat her recurrent nasal impetigo. 4.  Valsalva exercise multiple times a  day. 5.  The patient also complains of chronic cough and chronic throat irritation.  A referral to see laryngologist Dr. Ashok Croon will be arranged.    Past Medical  History:  Diagnosis Date   Abdominal pain 10/2013   Allergy    Aneurysm of splenic artery (HCC) 10/2013   Ref by Dr. Willis Modena   Benign hematuria    Chronic kidney disease    stage 3   Dyslipidemia    GERD (gastroesophageal reflux disease)    History of Crohn's disease    Hypertension    Osteoporosis    Paroxysmal atrial fibrillation [facility])     Past Surgical History:  Procedure Laterality Date   ABDOMINAL HYSTERECTOMY     BILATERAL OOPHORECTOMY     d/t cyst   BREAST BIOPSY     benign   CATARACT EXTRACTION, BILATERAL     COLONOSCOPY  2006 and 2011   Dr. Sherin Quarry   FOOT SURGERY Left 2012   KNEE SURGERY Right Aug. 2006   LEFT ATRIAL APPENDAGE OCCLUSION N/A 03/07/2023   Procedure: LEFT ATRIAL APPENDAGE OCCLUSION;  Surgeon: Lanier Prude, MD;  Location: [facility] INVASIVE CV LAB;  Service: Cardiovascular;  Laterality: N/A;   TEE WITHOUT CARDIOVERSION N/A 03/07/2023   Procedure: TRANSESOPHAGEAL ECHOCARDIOGRAM (TEE);  Surgeon: Lanier Prude, MD;  Location: [facility] INVASIVE CV LAB;  Service: Cardiovascular;  Laterality: N/A;   TUMOR EXCISION     from kidney    Family History  Problem Relation Age of Onset   Heart disease Mother 61       Heart Disease before age 62- Brain Aneurysm   Hypertension Mother    Aneurysm Mother        brain   Heart disease Sister        Brain Aneurysm   Aneurysm Sister        brain    Cancer Maternal Grandmother        Colon   Cancer Brother     Social History:  reports that she has never smoked. She has never used smokeless tobacco. She reports current alcohol use of about 1.0 standard drink of alcohol per week. She reports that she does not use drugs.  Allergies:  Allergies  Allergen Reactions   Dilaudid [Hydromorphone Hcl] Nausea And Vomiting   Ace Inhibitors Cough   Codeine Nausea Only   Crestor [Rosuvastatin] Other (See Comments)    Joints hurt   Demerol [Meperidine] Nausea Only   Lipitor [Atorvastatin] Other (See Comments)     Joints hurt   Iodinated Contrast Media Nausea And Vomiting    Abdominal pain   Sulfamethoxazole-Trimethoprim Other (See Comments)   Biaxin [Clarithromycin] Rash   Other Rash    EKG leads   Sulfa Antibiotics Rash   Zithromax [Azithromycin] Rash    Medications: I have reviewed the patient's current medications.  The PMH, PSH, Medications, Allergies, and SH were reviewed and updated.  ROS: Constitutional: Negative for fever, weight loss and weight gain. Cardiovascular: Negative for chest pain and dyspnea on exertion. Respiratory: Is not experiencing shortness of breath at rest. Gastrointestinal: Negative for nausea and vomiting. Neurological: Negative for headaches. Psychiatric: The patient is not nervous/anxious   PHYSICAL EXAM:  Exam: General: Well-developed, well-nourished Respiratory Respiratory effort: Equal inspiration and expiration without stridor Cardiovascular Peripheral Vascular: Warm extremities with equal color/perfusion Eyes: No nystagmus with equal extraocular motion bilaterally Neuro/Psych/Balance: Patient oriented to person, place, and time; Appropriate mood and affect; Gait is intact with no imbalance; Cranial  nerves I-XII are intact Head and Face Inspection: Normocephalic and atraumatic without mass or lesion Facial Strength: Facial motility symmetric and full bilaterally ENT Pinna: External ear intact and fully developed External canal: Canal is patent with intact skin Tympanic Membrane: Clear and mobile External Nose: No scar or anatomic deformity  Procedure note: Superior laryngeal nerve block (13086-57) and steroid injection, bilateral  ATTENDING: Ashok Croon, M.D.  PREOPERATIVE DIAGNOSIS(ES):chronic cough, superior laryngeal neuropathy POSTOPERATIVE DIAGNOSIS(ES): Same PROCEDURE PERFORMED: Local anesthetic and steroid injection to bilateral superior laryngeal nerve(s) INDICATIONS FOR PROCEDURE: Patient presents with chronic cough refractory  to medical management and with findings consistent with superior laryngeal nerve dysfunction. The risks and benefits of the surgical procedure have been explained in detail to the patient and they have elected to proceed. CONSENT:  Informed consent was obtained prior to the procedure after discussion of risks, benefits, and alternatives and expected outcomes were discussed with the patient; consent placed in chart. The possibilities of reaction to medication, increase in pain, bleeding, infection, prolonged numbness or dysphagia, inadequate treatment response, continued or increased pain, the need for additional procedures, failure to diagnose a condition, and creating a complication requiring transfusion or operation or additional intervention were discussed with the patient. The patient concurred with the proposed plan, giving informed consent.   UNIVERSAL PROTOCOL/ TIMEOUT: Preprocedure verification is complete- patient verified and consents confirmed. H&P REVIEW: The patient's history and physical were reviewed today prior to procedure. All medications were reviewed and updated as well. ANESTHESIA: lidocaine 1:100,000 with 1% epinephrine PROCEDURE DETAILS: The patient was brought to the clinic and placed in a seated position.  The thyroid cartilage and hyoid bone were palpated and the thyrohyoid membrane was identified. 1ml of kenalog 40mg /ml and 1ml of local anesthetic were mixed and injected at the site of the left and right superior laryngeal nerve entry through the thyrohyoid membrane, taking great care to avoid intra-arterial injection. This concluded the procedure. There were no complications and the patient tolerated the procedure well. ESTIMATED BLOOD LOSS: Minimal FINDINGS:  No evidence of significant bleeding or other complication CONDITION: Stable COMPLICATIONS: no significant complications DISPOSITION: The patient tolerated the procedure well without apparent complications and was  ambulatory. NOTES: has left sided tickle usually but sx are bilateral TOLERANCE:  (MD-assigned unless noted, 1-10 WITH 10 BEING WORST AND 1 BEING BEST): tolerated well 1  Studies Reviewed: CXR 2-View CLINICAL DATA:  Subacute cough   EXAM: CHEST - 2 VIEW   COMPARISON:  03/07/2023   FINDINGS: The heart size and mediastinal contours are within normal limits. Both lungs are clear. The visualized skeletal structures are unremarkable. Moderate hiatal hernia   IMPRESSION: No active cardiopulmonary disease. Moderate hiatal hernia.  Assessment/Plan: Encounter Diagnoses  Name Primary?   Chronic cough Yes   Chronic GERD    Environmental and seasonal allergies    Chronic nasal congestion    Deviated nasal septum    Hypertrophy of nasal turbinates    Irritable larynx syndrome    Age-related vocal fold atrophy    Glottic insufficiency    Dysphonia    Hypertrophy of both inferior nasal turbinates      Assessment and Plan    Chronic Cough Chronic cough for three years with throat irritation and sensation of a tickle on both sides, but mostly on the left. Dry cough mostly, but at times coughs up clear mucus. Symptoms of postnasal drainage, occasional mucus production, and environmental triggers such as dust. Negative chest x-ray 10/21/23.Mild asthma managed with albuterol  by PCP, no formal Pulmonary eval or PFTs.  I discussed common etiologies of chronic cough including GERD/allergies and PND and lung disease with the patient and explained that in most cases the cause of chronic cough is multi-factorial  Neurogenic cough is also a possibility. Discussed treatments: amitriptyline, gabapentin, and superior laryngeal nerve block (lidocaine and steroid injection). Risks are low; goal is to reduce cough frequency.  - Continue Xyzal 5 mg daily, Flonase, azelastine, 2 puffs b/l nares BID, Singulair 10 mg daily for management of post-nasal drainage - Medical management of GERD LPR - continue  PPI as below - Recommend Mucinex throat spray with benzocaine. - Consider superior laryngeal nerve block after holidays. - Follow up in late January for potential SLN block. - will consider Pulmonary referral in the future   Chronic nasal congestion, Postnasal Drip and suspected environmental triggers/allergies - nasal endoscopy today with mild septal deviation to the right and inferior turbinate hypertrophy, no polyps or pus and evidence of post-nasal drainage which is clear. Postnasal drip could be contributing to chronic cough. Managed with Xyzal, Flonase, and azelastine without significant improvement. - Continue Xyzal 5 mg daily, Flonase, azelastine, 2 puffs b/l nares BID, Singulair 10 mg daily for management of post-nasal drainage  Chronic Gastroesophageal Reflux Disease (GERD) GERD with heartburn and reflux despite pantoprazole take BID. Flexible laryngoscopy with moderate post-cricoid edema, VF atrophy and mild glottic insufficiency but normal VF movement b/l and no lesions. Discussed dietary changes and Reflux Gourmet to reduce reflux. Untreated reflux can cause chronic cough and increased mucus. - Continue pantoprazole 40 mg daily before breakfast. - Recommend lifestyle and dietary changes to avoid late meals. - Reflux Gourmet trial - she has hx of hiatal hernia which was also seen on CXR, will consider esophagram in the future    Mild Asthma Mild asthma managed with albuterol, providing symptomatic relief during coughing fits. - Continue albuterol as needed. - will consider Pulm referral in the future  Follow-up - Schedule follow-up for late January. - potential superior laryngeal nerve block when she returns.    Update 01/09/24  Assessment and Plan    Chronic Cough Chronic cough exacerbated by cold weather and associated with asthma. Multiple treatments including acid reflux management, Zyrtec, and nasal sprays have had limited success. Discussed superior laryngeal nerve  block for neurogenic cough. Explained procedure, rationale, and potential side effects (transient dysphagia, minor bruising). Discussed medications such as Tramadol, Gabapentin. Patient opted for SLN block over medications due to concerns about side effects and tolerance issues with gabapentin and amitriptyline.  Advised to track symptoms daily to monitor for cough improvement. - S/p 1st SLN block, bilateral today tolerated well - Instruct to track symptoms daily and note any improvements - Continue current treatments for acid reflux, allergies, and nasal sprays - Schedule follow-up appointment in 8 weeks to assess symptom improvement  Asthma Asthma exacerbated by cold weather, contributing to chronic cough. Currently managed with prescribed medications. - Continue current asthma management plan  Acid Reflux Acid reflux well-controlled with current treatment. Significant improvement with prescribed medication and spray. - Continue current acid reflux management plan  Allergic Rhinitis Allergic rhinitis managed with Zyrtec and nasal sprays. Reports ongoing mucus production and sinus issues. - Continue current allergy management with Zyrtec and nasal sprays  Follow-up - Schedule follow-up appointment in 8 weeks.  - potential 2nd SLN block when she returns     Ashok Croon, MD Otolaryngology [facility] Phone: [phone] Fax: [phone]    01/09/2024,  12:00 PM

## 2024-03-05 ENCOUNTER — Ambulatory Visit (INDEPENDENT_AMBULATORY_CARE_PROVIDER_SITE_OTHER): Payer: Medicare Other | Admitting: Otolaryngology

## 2024-03-26 DIAGNOSIS — M810 Age-related osteoporosis without current pathological fracture: Secondary | ICD-10-CM | POA: Diagnosis not present

## 2024-03-26 DIAGNOSIS — I129 Hypertensive chronic kidney disease with stage 1 through stage 4 chronic kidney disease, or unspecified chronic kidney disease: Secondary | ICD-10-CM | POA: Diagnosis not present

## 2024-03-26 DIAGNOSIS — I480 Paroxysmal atrial fibrillation: Secondary | ICD-10-CM | POA: Diagnosis not present

## 2024-03-26 DIAGNOSIS — E785 Hyperlipidemia, unspecified: Secondary | ICD-10-CM | POA: Diagnosis not present

## 2024-03-26 DIAGNOSIS — G720 Drug-induced myopathy: Secondary | ICD-10-CM | POA: Diagnosis not present

## 2024-03-26 DIAGNOSIS — R053 Chronic cough: Secondary | ICD-10-CM | POA: Diagnosis not present

## 2024-03-26 DIAGNOSIS — N183 Chronic kidney disease, stage 3 unspecified: Secondary | ICD-10-CM | POA: Diagnosis not present

## 2024-03-26 DIAGNOSIS — J4530 Mild persistent asthma, uncomplicated: Secondary | ICD-10-CM | POA: Diagnosis not present

## 2024-05-04 ENCOUNTER — Telehealth: Payer: Self-pay

## 2024-05-04 NOTE — Telephone Encounter (Signed)
 Called to check in with patient, who had LAAO on 03/07/2023. The patient reports doing well with no issues.  The patient understands to call with questions or concerns.   She was grateful for call and agreed with plan.

## 2024-05-15 DIAGNOSIS — H00021 Hordeolum internum right upper eyelid: Secondary | ICD-10-CM | POA: Diagnosis not present

## 2024-05-15 DIAGNOSIS — L0390 Cellulitis, unspecified: Secondary | ICD-10-CM | POA: Diagnosis not present

## 2024-05-16 DIAGNOSIS — E785 Hyperlipidemia, unspecified: Secondary | ICD-10-CM | POA: Diagnosis not present

## 2024-05-16 DIAGNOSIS — N183 Chronic kidney disease, stage 3 unspecified: Secondary | ICD-10-CM | POA: Diagnosis not present

## 2024-05-16 DIAGNOSIS — I480 Paroxysmal atrial fibrillation: Secondary | ICD-10-CM | POA: Diagnosis not present

## 2024-05-16 DIAGNOSIS — J4530 Mild persistent asthma, uncomplicated: Secondary | ICD-10-CM | POA: Diagnosis not present

## 2024-05-25 DIAGNOSIS — L0390 Cellulitis, unspecified: Secondary | ICD-10-CM | POA: Diagnosis not present

## 2024-05-25 DIAGNOSIS — H00021 Hordeolum internum right upper eyelid: Secondary | ICD-10-CM | POA: Diagnosis not present

## 2024-06-13 ENCOUNTER — Encounter (HOSPITAL_COMMUNITY): Payer: Self-pay | Admitting: Interventional Radiology

## 2024-06-15 DIAGNOSIS — E785 Hyperlipidemia, unspecified: Secondary | ICD-10-CM | POA: Diagnosis not present

## 2024-06-15 DIAGNOSIS — N183 Chronic kidney disease, stage 3 unspecified: Secondary | ICD-10-CM | POA: Diagnosis not present

## 2024-06-15 DIAGNOSIS — I480 Paroxysmal atrial fibrillation: Secondary | ICD-10-CM | POA: Diagnosis not present

## 2024-06-15 DIAGNOSIS — J4530 Mild persistent asthma, uncomplicated: Secondary | ICD-10-CM | POA: Diagnosis not present

## 2024-06-17 ENCOUNTER — Encounter: Payer: Self-pay | Admitting: Internal Medicine

## 2024-06-17 ENCOUNTER — Ambulatory Visit: Admitting: Internal Medicine

## 2024-06-17 VITALS — BP 122/78 | HR 69 | Temp 98.0°F | Ht 63.5 in | Wt 161.6 lb

## 2024-06-17 DIAGNOSIS — K219 Gastro-esophageal reflux disease without esophagitis: Secondary | ICD-10-CM

## 2024-06-17 DIAGNOSIS — R053 Chronic cough: Secondary | ICD-10-CM

## 2024-06-17 DIAGNOSIS — J4520 Mild intermittent asthma, uncomplicated: Secondary | ICD-10-CM

## 2024-06-17 DIAGNOSIS — K449 Diaphragmatic hernia without obstruction or gangrene: Secondary | ICD-10-CM

## 2024-06-17 DIAGNOSIS — Z7722 Contact with and (suspected) exposure to environmental tobacco smoke (acute) (chronic): Secondary | ICD-10-CM | POA: Diagnosis not present

## 2024-06-17 MED ORDER — GABAPENTIN 100 MG PO CAPS: 300.0000 mg | ORAL_CAPSULE | Freq: Three times a day (TID) | ORAL | 3 refills | Status: DC

## 2024-06-17 NOTE — Patient Instructions (Addendum)
 It was a pleasure to see you today!  Please schedule follow up with myself in 4 months.  If my schedule is not open yet, we will contact you with a reminder closer to that time. Please call [phone] if you haven't heard from us  a month before, and always call us  sooner if issues or concerns arise. You can also send us  a message through MyChart, but but aware that this is not to be used for urgent issues and it may take up to 5-7 days to receive a reply. Please be aware that you will likely be able to view your results before I have a chance to respond to them. Please give us  5 business days to respond to any non-urgent results.    VISIT SUMMARY:  Today, we discussed your persistent cough and voice loss, which have been ongoing for four years. We reviewed your history, including previous evaluations and treatments, and identified potential contributing factors such as postnasal drip, GERD, and a possible hiatal hernia. We also addressed your urinary incontinence related to coughing and confirmed that your atrial fibrillation remains well-controlled.  YOUR PLAN:  -CHRONIC COUGH: Your chronic cough is likely due to multiple factors, including postnasal drip and acid reflux. Asthma is less likely since inhalers have not helped. We will order an esophagram to check for reflux and a hiatal hernia, and refer you to Dr. San for a possible endoscopic evaluation. We will also start you on gabapentin to help with the cough reflex and advise you to stop using the albuterol inhaler if it is not helping.  -POSTNASAL DRIP: Postnasal drip is contributing to your chronic cough. Please continue taking Singulair and using your nasal sprays, Flonase and Astelin, as prescribed.  -GASTROESOPHAGEAL REFLUX DISEASE (GERD): GERD, or acid reflux, is likely contributing to your chronic cough. We will order an esophagram to assess the reflux and hiatal hernia, and refer you to a gastroenterologist for a possible  endoscopic intervention.  -HIATAL HERNIA: A hiatal hernia, where part of your stomach pushes up into your chest, may be contributing to your GERD and chronic cough. We will order an esophagram to evaluate this and refer you to a gastroenterologist for further evaluation.  INSTRUCTIONS:  Please schedule an esophagram to evaluate your reflux and hiatal hernia. We will also refer you to Dr. Sirigliano for a potential endoscopic evaluation and management of the hiatal hernia. Start taking gabapentin as prescribed and discontinue the albuterol inhaler if it is not providing relief. Continue using Singulair, Flonase, and Astelin for your postnasal drip. Follow up with a gastroenterologist for further evaluation of your GERD and hiatal hernia.  Start taking gabapentin for your cough.  Start at 100 mg at bedtime and over the next three days increase to 300 mg at bedtime. Once you reach this dose, you can then increase to 100 mg in the morning, 100 mg in the afternoon and 300 mg at night time. Increase your morning dose and then your afternoon dose every 2-3 days. Increase to a goal dosing of 300 mg three times a day or until your cough symptoms improve.   Common side effects: fatigue and nausea.

## 2024-06-17 NOTE — Progress Notes (Signed)
 Mary Riley    [id number]    05-27-1946  Primary Care Physician:White, Montie, MD  Referring Physician: Teresa Montie, MD [phone] [address] [place],  [place] [address] Reason for Consultation: coughing and sneezing Date of Consultation: 06/17/2024  Chief complaint:   Chief Complaint  Patient presents with   Consult    Coughing and sneezing x 4 years.     HPI:  Discussed the use of AI scribe software for clinical note transcription with the patient, who gave verbal consent to proceed.  History of Present Illness Mary Riley is a 78 year old female who presents with persistent cough and voice loss. She was referred by Dr. Teresa for evaluation of her persistent cough.  She has experienced a persistent cough for four years, which began after exposure to dust and mold while cleaning a friend's house. The cough is constant and has led to voice loss and difficulty speaking. It worsens when lying down, in cold environments, or after transitioning from hot to cold temperatures. Sneezing episodes are associated with the cough. She has a hiatal hernia.   She has been evaluated by an allergist and an ENT specialist, Dr. Soldatova. A nerve block was attempted without relief. Various treatments, including cough medications, prescription pearls, and home remedies, have been tried without success. She uses albuterol four to five times daily, which does not provide relief, and has used a steroid inhaler, fluticasone, for a month without improvement. Has not been prescribed any courses of prednisone  for bronchitis. Specifically denies dyspnea.   Her past medical history includes a diagnosis of asthma by one doctor, though other allergists have not confirmed this. She has a history of atrial fibrillation, which is well-controlled with medication. She also reports acid reflux and takes pantoprazole  once daily. She experiences postnasal drainage and uses Singulair, Flonase, and Astelin  regularly. She previously used Xyzal for a month but no longer takes it due to lack of efficacy  No shortness of breath is noted, but she experiences urinary incontinence associated with coughing. She has a history of secondhand smoke exposure from family members and worked as a Interior and spatial designer, Dealer frequently. Never had asthma flares then. She experienced annual bronchitis episodes in February for several years, which resolved after retirement.   Social history:  Occupation: Water quality scientist, also worked as Producer, television/film/video Smoking history: never smoker, passive smoke exposure in childhood  Social History   Occupational History   Not on file  Tobacco Use   Smoking status: Never   Smokeless tobacco: Never  Substance and Sexual Activity   Alcohol use: Yes    Alcohol/week: 1.0 standard drink of alcohol    Types: 1 Glasses of wine per week   Drug use: No   Sexual activity: Not on file    Relevant family history:  Family History  Problem Relation Age of Onset   Heart disease Mother 71       Heart Disease before age 59- Brain Aneurysm   Hypertension Mother    Aneurysm Mother        brain   Heart disease Sister        Brain Aneurysm   Aneurysm Sister        brain    Cancer Maternal Grandmother        Colon   Cancer Brother     Past Medical History:  Diagnosis Date   Abdominal pain 10/2013   Allergy  Aneurysm of splenic artery (HCC) 10/2013   Ref by Dr. Elsie Cree   Benign hematuria    Chronic kidney disease    stage 3   Dyslipidemia    GERD (gastroesophageal reflux disease)    History of Crohn's disease    Hypertension    Osteoporosis    Paroxysmal atrial fibrillation [facility])     Past Surgical History:  Procedure Laterality Date   ABDOMINAL HYSTERECTOMY     BILATERAL OOPHORECTOMY     d/t cyst   BREAST BIOPSY     benign   CATARACT EXTRACTION, BILATERAL     COLONOSCOPY  2006 and 2011   Dr. Nolon   FOOT SURGERY Left 2012   KNEE SURGERY  Right Aug. 2006   LEFT ATRIAL APPENDAGE OCCLUSION N/A 03/07/2023   Procedure: LEFT ATRIAL APPENDAGE OCCLUSION;  Surgeon: Cindie Ole DASEN, MD;  Location: [facility] INVASIVE CV LAB;  Service: Cardiovascular;  Laterality: N/A;   TEE WITHOUT CARDIOVERSION N/A 03/07/2023   Procedure: TRANSESOPHAGEAL ECHOCARDIOGRAM (TEE);  Surgeon: Cindie Ole DASEN, MD;  Location: [facility] INVASIVE CV LAB;  Service: Cardiovascular;  Laterality: N/A;   TUMOR EXCISION     from kidney     Physical Exam: Blood pressure 122/78, pulse 69, temperature 98 F (36.7 C), temperature source Oral, height 5' 3.5 (1.613 m), weight 161 lb 9.6 oz (73.3 kg), SpO2 96%. Gen:      No acute distress, frequent dry cough ENT:  +cobblestoning, no nasal polyps, mucus membranes moist Lungs:    No increased respiratory effort, symmetric chest wall excursion, clear to auscultation bilaterally, no wheezes or crackles CV:         Regular rate and rhythm; no murmurs, rubs, or gallops.  No pedal edema Abd:      + bowel sounds; soft, non-tender; no distension MSK: no acute synovitis of DIP or PIP joints, no mechanics hands.  Skin:      Warm and dry; no rashes Neuro: normal speech, no focal facial asymmetry Psych: alert and oriented x3, normal mood and affect   Data Reviewed/Medical Decision Making:  Independent interpretation of tests: Imaging:  Review of patient's CT Cardiac scan images revealed no acute pulmonary process in visualized lung windows. The patient's images have been independently reviewed by me.    PFTs:  Labs:  Lab Results  Component Value Date   NA 142 04/15/2023   K 4.5 04/15/2023   CO2 21 04/15/2023   GLUCOSE 87 04/15/2023   BUN 19 04/15/2023   CREATININE 1.12 (H) 04/15/2023   CALCIUM 9.4 04/15/2023   EGFR 51 (L) 04/15/2023   GFRNONAA 40 (L) 10/07/2020   Lab Results  Component Value Date   WBC 7.9 04/15/2023   HGB 11.8 04/15/2023   HCT 35.6 04/15/2023   MCV 90 04/15/2023   PLT 323 04/15/2023     Immunization  status:   There is no immunization history on file for this patient.   I reviewed prior external note(s) from ENT, GI  I reviewed the result(s) of the labs and imaging as noted above.   I have ordered esophagram   Assessment and Plan Assessment & Plan Chronic cough Chronic cough likely multifactorial, with postnasal drip and GERD as primary contributors. Asthma unlikely due to lack of response to inhalers. Possible reflux-related etiology due to symptom exacerbation in certain positions and conditions. Large hiatal hernia may contribute to reflux despite medication. - Order esophagram to evaluate reflux and hiatal hernia. - Refer to Dr. Sirigliano for potential endoscopic  evaluation and management of hiatal hernia. - Prescribe gabapentin for neurogenic cough reflex, titrate dose based on effect and tolerance. - Advise discontinuation of albuterol inhaler if ineffective.  Postnasal drip Postnasal drip contributing to chronic cough. - Continue Singulair and nasal sprays (Flonase and Astelin).  Gastroesophageal reflux disease (GERD) GERD likely contributing to chronic cough, with symptoms persisting despite pantoprazole , possibly due to hiatal hernia. - Order esophagram to assess reflux and hiatal hernia. - Refer to gastroenterologist for potential endoscopic intervention.  Hiatal hernia Large hiatal hernia suspected to contribute to GERD and chronic cough. - Order esophagram to evaluate hernia. - Refer to gastroenterologist for potential endoscopic intervention. - possible TIF procedure evaluation    Return to Care: Return in about 4 months (around 10/18/2024).  Verdon Gore, MD Pulmonary and Critical Care Medicine [facility] Office:[phone]  CC: Teresa Channel, MD

## 2024-06-24 ENCOUNTER — Telehealth: Payer: Self-pay | Admitting: *Deleted

## 2024-06-24 DIAGNOSIS — Z1231 Encounter for screening mammogram for malignant neoplasm of breast: Secondary | ICD-10-CM | POA: Diagnosis not present

## 2024-06-24 NOTE — Telephone Encounter (Signed)
 Copied from CRM [id number]. Topic: Clinical - Prescription Issue >> Jun 22, 2024  8:36 AM Leila BROCKS wrote: Reason for CRM: Patient [phone] saw Dr. Meade 06/17/24 and states [facility] only gave Gabapentin  300mg  not 100 mg. Patient has not start medication and wants correct prescription sent to the pharmacy.   [id number] [place], [place] - [address] AT [facility] [address] [place] [address] Phone: [phone]  Fax: [phone]  ATC x1.  VM full.  Mychart message sent.  Script for Gabapentin  100 mg sent to on 06/17/24 by Dr. Meade to [facility] so patient can taper up to 300 mg .

## 2024-06-29 ENCOUNTER — Telehealth: Payer: Self-pay

## 2024-06-29 MED ORDER — GABAPENTIN 100 MG PO CAPS: 300.0000 mg | ORAL_CAPSULE | Freq: Three times a day (TID) | ORAL | 3 refills | Status: AC

## 2024-06-29 NOTE — Telephone Encounter (Signed)
 Copied from CRM [id number]. Topic: Clinical - Prescription Issue >> Jun 29, 2024  9:35 AM Rilla B wrote: Patient states she was to received 100mg  of Gabapentin .Per dr Meade notes, script was sent on 7/02. Patients states [facility] has not received script and she wants to get started. Patient states this has been going on for over a week now.  Please call patient and update on the statues of the 100mg .   I called and spoke to the pt. Pt states the has he Gabapentin  300mg  that she takes at night but she still needs the Gabapentin  100 mg TID. I informed pt that I could resend in the Gabapentin  100 mg TID to her pharmacy and for her to just call our office with any further issues. Pt verbalized understanding. NFN

## 2024-07-18 DIAGNOSIS — M25552 Pain in left hip: Secondary | ICD-10-CM | POA: Diagnosis not present

## 2024-08-21 ENCOUNTER — Encounter: Payer: Self-pay | Admitting: Internal Medicine

## 2024-09-03 DIAGNOSIS — L821 Other seborrheic keratosis: Secondary | ICD-10-CM | POA: Diagnosis not present

## 2024-09-03 DIAGNOSIS — R202 Paresthesia of skin: Secondary | ICD-10-CM | POA: Diagnosis not present

## 2024-09-03 DIAGNOSIS — L814 Other melanin hyperpigmentation: Secondary | ICD-10-CM | POA: Diagnosis not present

## 2024-09-03 DIAGNOSIS — D485 Neoplasm of uncertain behavior of skin: Secondary | ICD-10-CM | POA: Diagnosis not present

## 2024-09-04 NOTE — Telephone Encounter (Signed)
 Done

## 2024-09-14 DIAGNOSIS — B349 Viral infection, unspecified: Secondary | ICD-10-CM | POA: Diagnosis not present

## 2024-09-24 ENCOUNTER — Ambulatory Visit: Admitting: Internal Medicine

## 2024-09-28 ENCOUNTER — Ambulatory Visit: Admitting: Internal Medicine

## 2024-09-28 DIAGNOSIS — I129 Hypertensive chronic kidney disease with stage 1 through stage 4 chronic kidney disease, or unspecified chronic kidney disease: Secondary | ICD-10-CM | POA: Diagnosis not present

## 2024-09-28 DIAGNOSIS — I480 Paroxysmal atrial fibrillation: Secondary | ICD-10-CM | POA: Diagnosis not present

## 2024-09-28 DIAGNOSIS — M810 Age-related osteoporosis without current pathological fracture: Secondary | ICD-10-CM | POA: Diagnosis not present

## 2024-09-28 DIAGNOSIS — E538 Deficiency of other specified B group vitamins: Secondary | ICD-10-CM | POA: Diagnosis not present

## 2024-09-28 DIAGNOSIS — E785 Hyperlipidemia, unspecified: Secondary | ICD-10-CM | POA: Diagnosis not present

## 2024-09-28 DIAGNOSIS — Z0000 Encounter for general adult medical examination without abnormal findings: Secondary | ICD-10-CM | POA: Diagnosis not present

## 2024-09-28 DIAGNOSIS — I671 Cerebral aneurysm, nonruptured: Secondary | ICD-10-CM | POA: Diagnosis not present

## 2024-09-28 DIAGNOSIS — J4520 Mild intermittent asthma, uncomplicated: Secondary | ICD-10-CM

## 2024-09-28 DIAGNOSIS — N183 Chronic kidney disease, stage 3 unspecified: Secondary | ICD-10-CM | POA: Diagnosis not present

## 2024-09-28 DIAGNOSIS — I728 Aneurysm of other specified arteries: Secondary | ICD-10-CM | POA: Diagnosis not present

## 2024-09-28 DIAGNOSIS — M25559 Pain in unspecified hip: Secondary | ICD-10-CM | POA: Diagnosis not present

## 2024-09-28 DIAGNOSIS — R053 Chronic cough: Secondary | ICD-10-CM

## 2024-09-28 DIAGNOSIS — Z79899 Other long term (current) drug therapy: Secondary | ICD-10-CM | POA: Diagnosis not present

## 2024-09-29 ENCOUNTER — Other Ambulatory Visit: Payer: Self-pay | Admitting: Family Medicine

## 2024-09-29 DIAGNOSIS — I671 Cerebral aneurysm, nonruptured: Secondary | ICD-10-CM

## 2024-10-02 ENCOUNTER — Other Ambulatory Visit: Payer: Self-pay | Admitting: Family Medicine

## 2024-10-02 DIAGNOSIS — I728 Aneurysm of other specified arteries: Secondary | ICD-10-CM

## 2024-10-13 ENCOUNTER — Telehealth: Payer: Self-pay | Admitting: Cardiology

## 2024-10-13 NOTE — Telephone Encounter (Signed)
 Patient returned call. Reviewed device, model number, lot number and company. Explained would reach out to device rep and ask a card be mailed to her as she doesn't use MyChart.

## 2024-10-13 NOTE — Telephone Encounter (Signed)
 Left voicemail for patient explaining information will be sent to her as a MyChart message for her to review. If she has questions, she can return call.

## 2024-10-13 NOTE — Telephone Encounter (Signed)
 Patient wants a call back to get the Manufacturer Name and Model Number and Date Implanted of her device.

## 2024-11-04 ENCOUNTER — Telehealth (HOSPITAL_BASED_OUTPATIENT_CLINIC_OR_DEPARTMENT_OTHER): Payer: Self-pay | Admitting: *Deleted

## 2024-11-04 NOTE — Telephone Encounter (Signed)
   Pre-operative Risk Assessment    Patient Name: Mary Riley  DOB: 1946-03-18 MRN: [id number]   Date of last office visit: 01/03/24 DR. NISHAN Date of next office visit: NONE   Request for Surgical Clearance    Procedure:  RIGHT FOOT 2ND WEBSPACE MORTON'S NEUROMA EXCISION, 2ND MT WEIL OSTEOTOMY   Date of Surgery:  Clearance TBD                                Surgeon:  DR. NORLEEN HEWITT Surgeon's Group or Practice Name:  [facility] Phone number:  [phone] MEGAN DAVIS Fax number:  [phone]   Type of Clearance Requested:   - Medical    Type of Anesthesia:  General    Additional requests/questions:    Bonney Niels Jest   11/04/2024, 5:08 PM

## 2024-11-06 ENCOUNTER — Telehealth: Payer: Self-pay

## 2024-11-06 NOTE — Telephone Encounter (Signed)
   Name: Mary Riley  DOB: [birth year]  MRN: [id number]  Primary Cardiologist: Maude Emmer, MD   Preoperative team, please contact this patient and set up a phone call appointment for further preoperative risk assessment. Please obtain consent and complete medication review. Thank you for your help.  I confirm that guidance regarding antiplatelet and oral anticoagulation therapy has been completed and, if necessary, noted below.  Per office protocol, if patient is without any new symptoms or concerns at the time of their virtual visit, she may hold ASA for 7 days prior to procedure. Please resume ASA as soon as possible postprocedure, at the discretion of the surgeon.    I also confirmed the patient resides in the state of [place] . As per [facility] Medical Board telemedicine laws, the patient must reside in the state in which the provider is licensed.   Lamarr Satterfield, NP 11/06/2024, 7:40 AM [facility]

## 2024-11-06 NOTE — Telephone Encounter (Signed)
 Preop tele appt now scheduled, med rec and consent done. Patient states that the procedure will be some time in February. Preop clearance appt scheduled based on TBD estimate.

## 2024-11-06 NOTE — Telephone Encounter (Signed)
  Patient Consent for Virtual Visit        [name] has provided verbal consent on 11/06/2024 for a virtual visit (video or telephone).   CONSENT FOR VIRTUAL VISIT FOR:  Mary Riley  By participating in this virtual visit I agree to the following:  I hereby voluntarily request, consent and authorize [facility] and its employed or contracted physicians, physician assistants, nurse practitioners or other licensed health care professionals (the Practitioner), to provide me with telemedicine health care services (the "Services) as deemed necessary by the treating Practitioner. I acknowledge and consent to receive the Services by the Practitioner via telemedicine. I understand that the telemedicine visit will involve communicating with the Practitioner through live audiovisual communication technology and the disclosure of certain medical information by electronic transmission. I acknowledge that I have been given the opportunity to request an in-person assessment or other available alternative prior to the telemedicine visit and am voluntarily participating in the telemedicine visit.  I understand that I have the right to withhold or withdraw my consent to the use of telemedicine in the course of my care at any time, without affecting my right to future care or treatment, and that the Practitioner or I may terminate the telemedicine visit at any time. I understand that I have the right to inspect all information obtained and/or recorded in the course of the telemedicine visit and may receive copies of available information for a reasonable fee.  I understand that some of the potential risks of receiving the Services via telemedicine include:  Delay or interruption in medical evaluation due to technological equipment failure or disruption; Information transmitted may not be sufficient (e.g. poor resolution of images) to allow for appropriate medical decision making by the Practitioner;  and/or  In rare instances, security protocols could fail, causing a breach of personal health information.  Furthermore, I acknowledge that it is my responsibility to provide information about my medical history, conditions and care that is complete and accurate to the best of my ability. I acknowledge that Practitioner's advice, recommendations, and/or decision may be based on factors not within their control, such as incomplete or inaccurate data provided by me or distortions of diagnostic images or specimens that may result from electronic transmissions. I understand that the practice of medicine is not an exact science and that Practitioner makes no warranties or guarantees regarding treatment outcomes. I acknowledge that a copy of this consent can be made available to me via my patient portal [facility] MyChart), or I can request a printed copy by calling the office of [facility].    I understand that my insurance will be billed for this visit.   I have read or had this consent read to me. I understand the contents of this consent, which adequately explains the benefits and risks of the Services being provided via telemedicine.  I have been provided ample opportunity to ask questions regarding this consent and the Services and have had my questions answered to my satisfaction. I give my informed consent for the services to be provided through the use of telemedicine in my medical care

## 2024-11-11 ENCOUNTER — Ambulatory Visit
Admission: RE | Admit: 2024-11-11 | Discharge: 2024-11-11 | Disposition: A | Discharge status: Home / Self Care | Source: Ambulatory Visit | Attending: Family Medicine | Admitting: Family Medicine

## 2024-11-11 DIAGNOSIS — I671 Cerebral aneurysm, nonruptured: Secondary | ICD-10-CM

## 2024-12-14 ENCOUNTER — Ambulatory Visit
Admission: RE | Admit: 2024-12-14 | Discharge: 2024-12-14 | Disposition: A | Source: Ambulatory Visit | Attending: Family Medicine | Admitting: Family Medicine

## 2024-12-14 DIAGNOSIS — I728 Aneurysm of other specified arteries: Secondary | ICD-10-CM
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
## 2025-01-13 ENCOUNTER — Ambulatory Visit: Admitting: Physician Assistant

## 2025-01-13 DIAGNOSIS — Z01810 Encounter for preprocedural cardiovascular examination: Secondary | ICD-10-CM

## 2025-01-13 NOTE — Telephone Encounter (Signed)
 Notes faxed to surgeon.  Glendia Ferrier, PA-C  01/13/2025 4:39 PM

## 2025-01-13 NOTE — Progress Notes (Signed)
"  ° °  Virtual Visit via Telephone Note for Surgical Clearance    Because of Mary Riley co-morbid illnesses, she is at least at moderate risk for complications without adequate follow up.  This format is felt to be most appropriate for this patient at this time.  Due to technical limitations with video connection (technology), today's appointment will be conducted as an audio only telehealth visit, and [name] verbally agreed to proceed in this manner.   All issues noted in this document were discussed and addressed.  No physical exam could be performed with this format.  Evaluation Performed:  Preoperative cardiovascular risk assessment _____________   Date:  01/13/2025   Patient ID:  Mary Riley, DOB November 15, 1946, MRN [id number]  Patient Location:  Provider location:  Home Office   Primary Care Provider:  Teresa Channel, MD Primary Cardiologist:  Maude Emmer, MD  Patient Profile  Paroxysmal atrial fibrillation S/p LAAO ([facility]) 02/2023 Mitral regurgitation TTE 12/06/2022: EF 60-65, mild MR TEE 03/07/2023: EF 60-65, mild-moderate MR CT 04/2023: CAC score 0 Splenic artery aneurysm Paraophthalmic R ICA saccular aneurysm Hypertension Hyperlipidemia Chronic kidney disease  History of Present Illness    Mary Riley is a 79 y.o. female who presents via audio/video conferencing for a telehealth visit today.   Pt was last seen in cardiology clinic on 01/03/2024 by Dr. Emmer.  At that time [name] was doing well .    Pending Surgery: Right foot Morton's neuroma excision Type of anesthesia: General Medications to be held: N/A  Since her last visit, she has done well without chest pain, shortness of breath or syncope.  She does have a visit with Dr. Emmer next week.  She prefers to complete this visit today so that she can get her surgery scheduled.   Physical Exam  Vital Signs:  Gypsy Fester does not have vital signs available for review today. Given telephonic nature of  communication, physical exam is limited. AAOx3. NAD. Normal affect.  Speech and respirations are unlabored.  09/28/2024: Creatinine 0.99 Assessment & Plan   Assessment & Plan Preoperative cardiovascular examination Ms. Stclair does not have any unstable cardiac conditions.  She does not have a history of valvular heart degrees of a moderate degree or greater.  Her perioperative risk of a major cardiac event is 0.4% according to the Revised Cardiac Risk Index (RCRI).  Therefore, she is at low risk for perioperative complications.   Her functional capacity is good at 4.31 METs according to the Duke Activity Status Index (DASI). Recommendations: According to ACC/AHA guidelines, no further cardiovascular testing needed.  The patient may proceed to surgery at acceptable risk.     A copy of this note will be routed to requesting surgeon.  Time:   Today, I have spent 6 minutes with the patient with telehealth technology discussing medical history, symptoms, and management plan.     Glendia Ferrier, PA-C 01/13/2025, 1:29 PM  "

## 2025-01-19 ENCOUNTER — Ambulatory Visit: Admitting: Cardiovascular Disease

## 2025-04-09 ENCOUNTER — Ambulatory Visit: Admitting: Cardiovascular Disease
